# Patient Record
Sex: Female | Born: 1945 | Race: Black or African American | Hispanic: No | Smoking: Former smoker
Health system: Southern US, Community
[De-identification: ages and names within clinical notes are randomized; demographics above are authoritative.]

## PROBLEM LIST (undated history)

## (undated) DIAGNOSIS — I10 Essential (primary) hypertension: Secondary | ICD-10-CM

## (undated) DIAGNOSIS — Z853 Personal history of malignant neoplasm of breast: Secondary | ICD-10-CM

## (undated) HISTORY — PX: MASTECTOMY: SHX3

---

## 2001-09-08 ENCOUNTER — Emergency Department (HOSPITAL_COMMUNITY): Admission: EM | Admit: 2001-09-08 | Discharge: 2001-09-08 | Payer: Self-pay | Admitting: Emergency Medicine

## 2004-12-27 ENCOUNTER — Ambulatory Visit: Payer: Self-pay | Admitting: Physical Medicine & Rehabilitation

## 2004-12-27 ENCOUNTER — Inpatient Hospital Stay (HOSPITAL_COMMUNITY): Admission: EM | Admit: 2004-12-27 | Discharge: 2005-01-15 | Payer: Self-pay | Admitting: Emergency Medicine

## 2005-01-15 ENCOUNTER — Inpatient Hospital Stay (HOSPITAL_COMMUNITY)
Admission: RE | Admit: 2005-01-15 | Discharge: 2005-03-01 | Payer: Self-pay | Admitting: Physical Medicine & Rehabilitation

## 2005-01-15 ENCOUNTER — Ambulatory Visit: Payer: Self-pay | Admitting: Physical Medicine & Rehabilitation

## 2005-03-02 ENCOUNTER — Ambulatory Visit: Payer: Self-pay | Admitting: Family Medicine

## 2005-03-06 ENCOUNTER — Ambulatory Visit: Payer: Self-pay | Admitting: *Deleted

## 2005-04-02 ENCOUNTER — Encounter
Admission: RE | Admit: 2005-04-02 | Discharge: 2005-07-01 | Payer: Self-pay | Admitting: Physical Medicine & Rehabilitation

## 2005-04-02 ENCOUNTER — Ambulatory Visit: Payer: Self-pay | Admitting: Physical Medicine & Rehabilitation

## 2005-07-30 ENCOUNTER — Ambulatory Visit: Payer: Self-pay | Admitting: Physical Medicine & Rehabilitation

## 2005-07-30 ENCOUNTER — Encounter
Admission: RE | Admit: 2005-07-30 | Discharge: 2005-10-28 | Payer: Self-pay | Admitting: Physical Medicine & Rehabilitation

## 2005-09-10 ENCOUNTER — Ambulatory Visit (HOSPITAL_COMMUNITY): Admission: RE | Admit: 2005-09-10 | Discharge: 2005-09-10 | Payer: Self-pay | Admitting: Interventional Radiology

## 2005-11-09 IMAGING — CR DG RIBS 2V*R*
2 series · 2 of 2 positions shown · non-contrast
Comparison: 01/05/2005.

CLINICAL DATA: Right anterior and lateral rib pain following a fall.

CHEST - 2 VIEW

[view not recorded (1 of 2)]
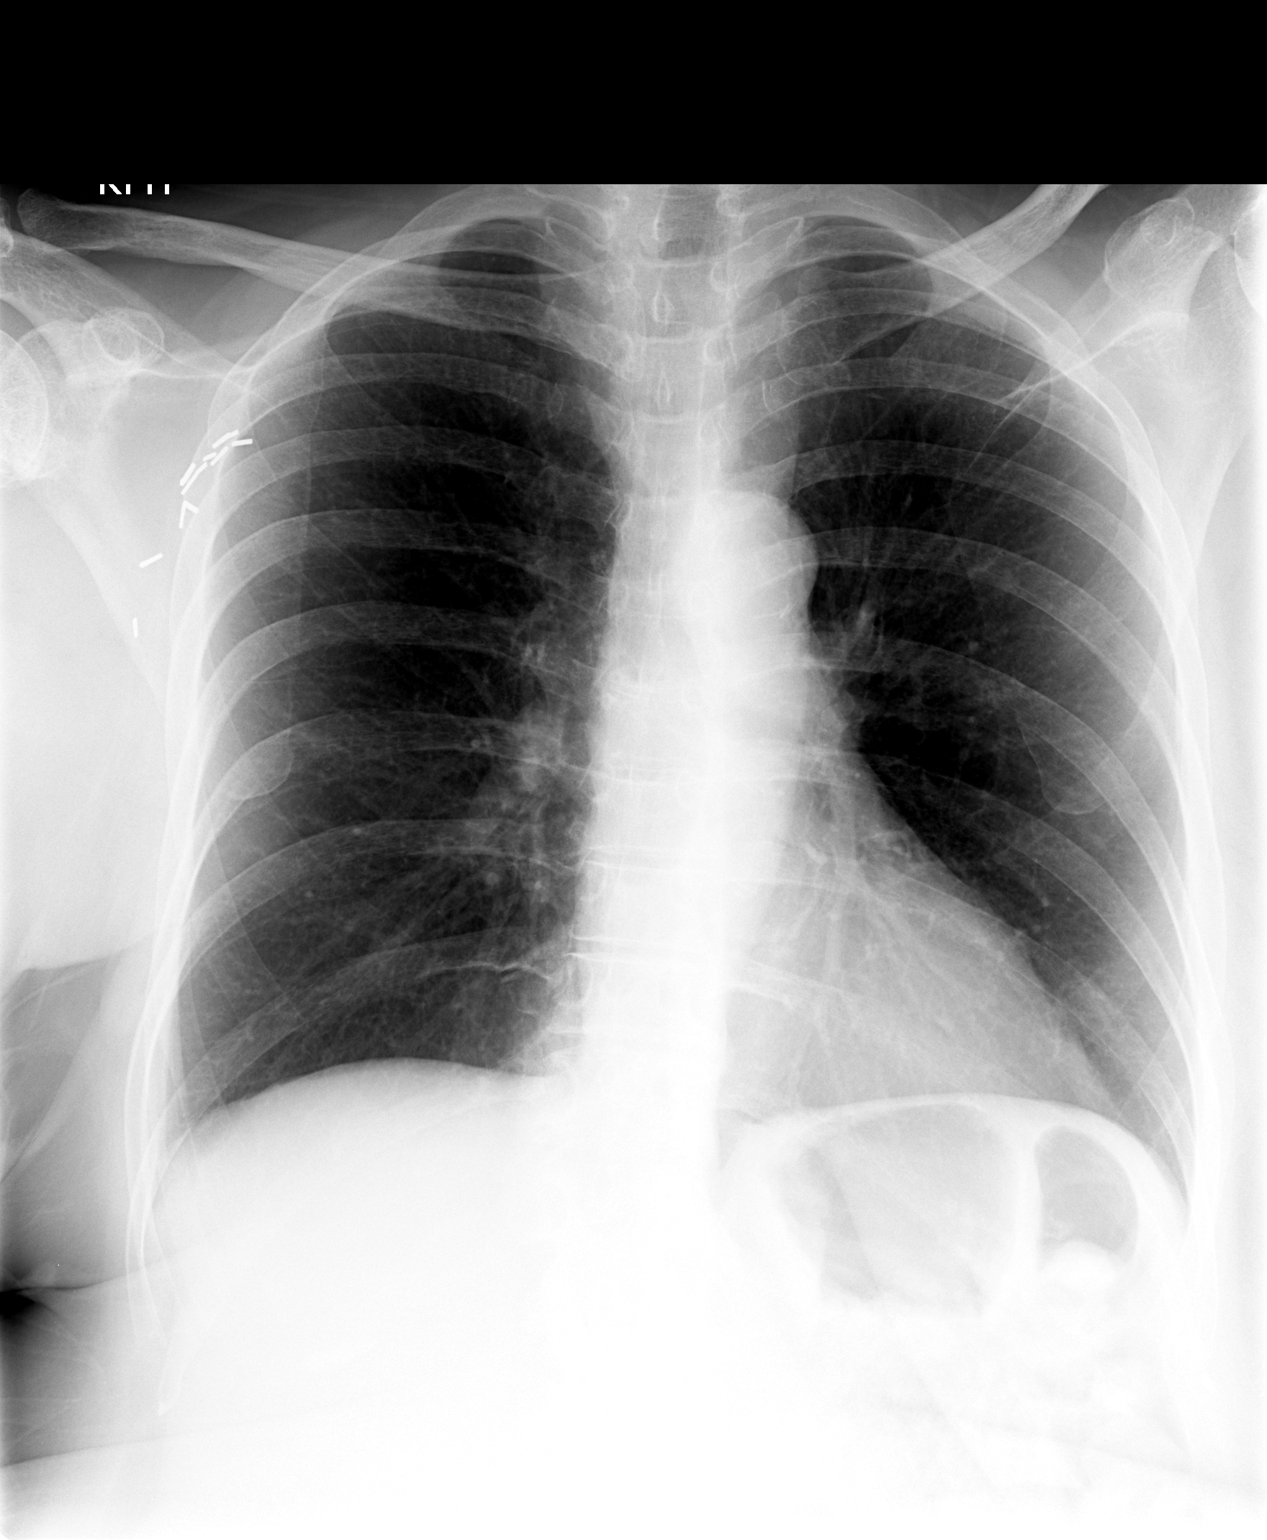

[view not recorded (2 of 2)]
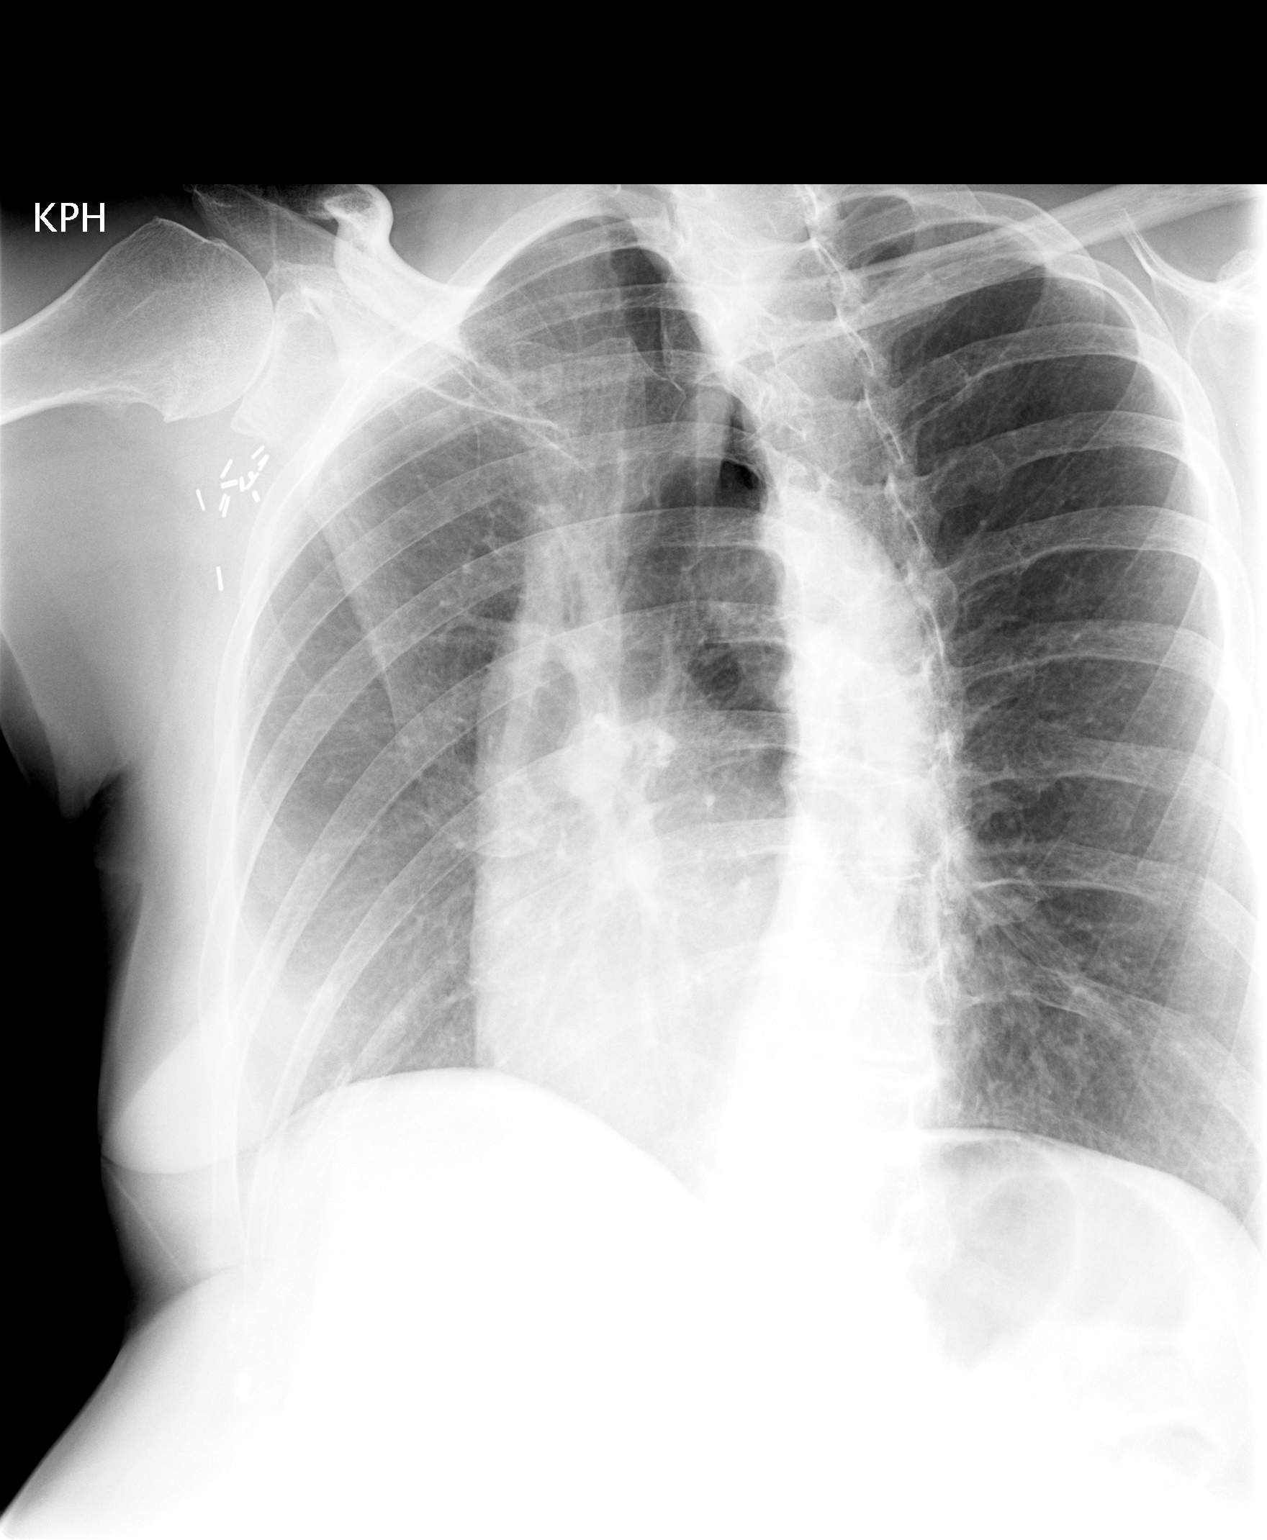

[2 of 2 positions shown; findings below may reference images not displayed]

FINDINGS: Improved inspiration. Normal sized heart. Mild diffuse peribronchial
thickening without significant change. Stable right axillary surgical clips.
Mild thoracic spine degenerative changes. Diffuse osteopenia. No fracture or
pneumothorax seen.

IMPRESSION

Stable mild chronic bronchitic changes. No acute abnormality.

RIGHT RIBS - 2 VIEW
FINDINGS: On the oblique view, a skin fold is crossing the lateral aspect of
the right 9th rib, simulating an oblique fracture line. No true fracture is seen
in the 9th rib or any of the other ribs. No pneumothorax.

IMPRESSION

No fracture.

## 2005-12-25 ENCOUNTER — Encounter
Admission: RE | Admit: 2005-12-25 | Discharge: 2006-03-25 | Payer: Self-pay | Admitting: Physical Medicine & Rehabilitation

## 2005-12-26 ENCOUNTER — Ambulatory Visit: Payer: Self-pay | Admitting: Physical Medicine & Rehabilitation

## 2006-02-04 ENCOUNTER — Ambulatory Visit: Payer: Self-pay | Admitting: Physical Medicine & Rehabilitation

## 2006-04-24 ENCOUNTER — Emergency Department (HOSPITAL_COMMUNITY): Admission: EM | Admit: 2006-04-24 | Discharge: 2006-04-24 | Payer: Self-pay | Admitting: Family Medicine

## 2006-06-03 ENCOUNTER — Ambulatory Visit (HOSPITAL_COMMUNITY): Admission: RE | Admit: 2006-06-03 | Discharge: 2006-06-03 | Payer: Self-pay | Admitting: Interventional Radiology

## 2006-09-11 ENCOUNTER — Emergency Department (HOSPITAL_COMMUNITY): Admission: EM | Admit: 2006-09-11 | Discharge: 2006-09-11 | Payer: Self-pay | Admitting: Emergency Medicine

## 2006-12-23 ENCOUNTER — Ambulatory Visit (HOSPITAL_COMMUNITY): Admission: RE | Admit: 2006-12-23 | Discharge: 2006-12-23 | Payer: Self-pay | Admitting: Interventional Radiology

## 2007-01-02 ENCOUNTER — Emergency Department (HOSPITAL_COMMUNITY): Admission: EM | Admit: 2007-01-02 | Discharge: 2007-01-02 | Payer: Self-pay | Admitting: Family Medicine

## 2007-07-24 ENCOUNTER — Ambulatory Visit (HOSPITAL_COMMUNITY): Admission: RE | Admit: 2007-07-24 | Discharge: 2007-07-24 | Payer: Self-pay | Admitting: Family Medicine

## 2007-07-30 ENCOUNTER — Ambulatory Visit (HOSPITAL_COMMUNITY): Admission: RE | Admit: 2007-07-30 | Discharge: 2007-07-30 | Payer: Self-pay | Admitting: Interventional Radiology

## 2008-11-16 ENCOUNTER — Emergency Department (HOSPITAL_COMMUNITY): Admission: EM | Admit: 2008-11-16 | Discharge: 2008-11-16 | Payer: Self-pay | Admitting: Family Medicine

## 2009-10-27 ENCOUNTER — Encounter: Admission: RE | Admit: 2009-10-27 | Discharge: 2009-10-27 | Payer: Self-pay | Admitting: Internal Medicine

## 2010-12-29 NOTE — Assessment & Plan Note (Signed)
MEDICAL RECORD NUMBER:  [id number]   INTERVAL HISTORY:  [name] is here in followup of her subarachnoid hemorrhage  due to aneurysm status post coiling.  She was on rehab until July 20 and now  has been home with her mother since that time.  Patient has made very nice  gains.  Her pain has been minimal.  Her memory has been improving although  she is still forgetting some day to day things.  She denies any fall.  She  is sleeping fairly well.  In fact, she stopped her trazodone and has still  been doing fairly nicely with this.  She has had full-time supervision  really since being home.  Blood pressure has been under fair control.  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  She denies any blurred vision, double  vision, depression, agitation, anxiety.  She seems to be getting along for  the most well at home.  She is helping with the upkeep of the house in fact.   REVIEW OF SYSTEMS:  Patient denies any neurological, psychiatric,  constitutional, GU, GI, or cardiorespiratory problems today.   SOCIAL HISTORY:  Patient is widowed and lives with her mother as mentioned  above.   PHYSICAL EXAMINATION:  Patient is pleasant in no acute distress.  She is  alert and oriented x3.  Affect is bright and appropriate.  Appearance is  well kept.  Gait is slightly unsteady favoring the left side but there were  no signs of obvious fall risk today.  Coordination was fair.  Reflexes were  2+.  Sensation was normal.  Cognition was excellent.  She was alert to  current events.  She remembered two out of three objects after five minutes.  She was able to do serial 7's five out of five.  She was able to sequence  numbers one out of two times.  She was able to spell the word WORLD  forwards and backwards.  She did abstract thinking without difficulty today.  She recalled current affairs.  She asked appropriate questions about  medications, etc.  Heart was regular rate and rhythm.  Lungs were clear.  Abdomen soft  and nontender.  Motor function was generally 5/5.   ASSESSMENT:  1.  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  2.  Hypertension.  3.  Insomnia.   PLAN:  1.  Patient has made extraordinary gains up to this point.  I think she is      appropriate for being alone for an hour or two during the day and the      family can expand those hours as they see fit.  2.  Continue Catapres for hypertension.  3.  Decrease Zyprexa at 2.5 mg to bedtime.  4.  Decrease Amantidine to off.  5.  Stop trazodone.  6.  Continue Aricept at 10 mg at bedtime.  7.  Consider neuropsychological testing at next visit.  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  8.  I will see her back in about a month's time.     Ranelle Oyster, M.D.  Electronically Signed    ZTS/MedQ  D:  04/03/2005 16:16:48  T:  04/03/2005 22:17:59  Job #:  [id number]

## 2010-12-29 NOTE — Discharge Summary (Signed)
[name], [name]                ACCOUNT NO.:  [id number]   MEDICAL RECORD NO.:  [id number]          PATIENT TYPE:  IPS   LOCATION:  4025                         FACILITY:  [facility]   PHYSICIAN:  Ranelle Oyster, M.D.DATE OF BIRTH:  [birth year]   DATE OF ADMISSION:  01/15/2005  DATE OF DISCHARGE:  03/01/2005                                 DISCHARGE SUMMARY   DISCHARGE DIAGNOSES:  1.  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  2.  Pneumonia, resolved.  3.  Hypertension.  4.  Insomnia, improved.   HISTORY OF PRESENT ILLNESS:  [name] is a 65 year old female originally  admitted to [facility] May 17 with a two to three-day history of  confusion.  A CT of the head done showed diffuse subarachnoid hemorrhage  with clot in ACA in proximal segment, with moderate left frontal edema and a  7 mm left-to-right shift with question of ruptured aneurysm.  Cerebral  angiogram May 17 showed no evidence of aneurysm.  A chest x-ray did show  right lower lobe density with question of pneumonia, and the patient was  started on Avelox for treatment.  A repeat cerebral angiogram on May 23  showed 3.5 mm ACOM aneurysm that was coiled on May 25 without difficulty.  Patient with left-sided weakness post procedure.  CT of head past procedure  showed stable interhemisphere blood.  She was noted to have issues of  vasospasm requiring Nimotop for control.  Follow-up CT scans have shown  decrease in the size of subarachnoid hemorrhage and intraparenchymal  hemorrhage.  The patient continues with left hemiparesis with decrease  __________ decreased balance.  She is noted to be impulsive with poor safety  awareness.  She is currently at minimum assist for transfers, +2 toilet  assist, 70-80% to ambulate.  OT working on sequencing.   PAST MEDICAL HISTORY:  Significant for asthma and right mastectomy for  cancer.   ALLERGIES:  SHELLFISH.   FAMILY HISTORY:  Positive for CVA.   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  She lives in a one-level home with no steps at entry.  Smokes one pack per day.  Does not use any alcohol.   HOSPITAL COURSE:  Ms. [name] was admitted to rehab on January 15, 2005,  for inpatient therapies to consist of PT, OT, speech therapy daily.  Past  admission, Catapres was used initially on 0.1 mg t.i.d. basis.  Nimotop was  tapered off without difficulty.  Labs done for follow-up of acute  posthemorrhagic anemia have shown H&H slowly improving with last check of  CBC revealing hemoglobin 11.1, hematocrit 33.5, white count 5.0, platelets  215.  Check of electrolytes last on June 13 reveals sodium 145, potassium  3.8, chloride 107, CO2 28, BUN 15, creatinine 0.7, glucose 85.  LFTs within  normal limits.  Initially the patient has had problems with attention as  well as follow-through.  She was noted to have difficulty tending to  tasks  with confabulation and question hallucination.  She was also noted to have  problems with insomnia and agitation, especially at night.  She was started  on trazodone initially without much help.  Zyprexa was added to help with  hallucination and agitation at night.  Due to problems with initiation,  bromocriptine was added to help with this.  This showed some minimal  improvement; therefore, Aricept was added to help with memory function.  Notice also neuropsychiatry has been following along.  The patient has shown  a slow but increasing awareness of her deficits.  She is also noted to have  increase in intellectual awareness of some cognitive deficits.  Blood  pressure medications have slowly been adjusted as BP is noted to be  variable.  Catapres was increased to TTS-2 at time of discharge.  Other  medication adjustments include tapering amantadine and Zyprexa over the next  four  weeks.  Aricept was increased to 10 mg q.h.s.  At time of discharge,  the patient is modified independent for bed mobility, modified independent  in supervised setting in her room, requires supervision outside her room.  She is able to ambulate 150-300 feet at supervision to close supervision  basis with use of straight cane.  She is noted to have some decrease in  awareness and has shown increase in safety in compensating for balance  issues.  She is currently at modified independent for ADL needs, including  toileting.  Her basic high-level comprehension is intact.  Basic high-level  expression is intact.  No signs of apraxia or dysarthria are noted.  She is  able to use a memory log to assist with some memory deficits.  Problem  solving skills are improving.  Twenty-four hour supervision is recommended  past discharge, and patient's mother is to provide this.  She will continue  to receive follow-up home health PT/OT by [facility] past discharge.  On March 01, 2005, the patient is discharged to home.   DISCHARGE MEDICATIONS:  1.  Aricept 10 mg q.h.s.  2.  Zyprexa 5 mg q.h.s. x2 weeks, then 2.5 mg q.h.s. x2 weeks until gone.  3.  Trazodone 50 mg q.h.s.  4.  Amantadine 100 mg b.i.d. for two weeks, then one per day x2 weeks until      gone.  5.  Catapres TTS-2 patch, change weekly on Thursdays.  6.  Coated aspirin 325 mg a day.  7.  Senokot S two p.o. q.h.s.  8.  Pepcid 20 mg b.i.d. p.r.n.   ACTIVITY LEVEL:  As tolerated with supervision.  No driving for now.  Use  cane for ambulation.   DIET:  Regular.   SPECIAL INSTRUCTIONS:  [facility] to provide PT/OT.  Patient set up  for follow-up with [facility] July 21.  Patient to follow up with Dr.  Riley Kill August 22 at 3:40.       PP/MEDQ  D:  03/02/2005  T:  03/03/2005  Job:  [id number]   cc:   Sherilyn Cooter A. Pool, M.D.  [address]  [place]  [place] [address] Fax: [phone]   Grandville Silos. Corliss Skains, M.D.   [address]  [place]  [place] [address]  Fax: [phone]

## 2010-12-29 NOTE — Assessment & Plan Note (Signed)
INTERVAL HISTORY:  [name] is back regarding her subarachnoid hemorrhage.  She  has continued to progress from a functional standpoint.  She is actually  home with her mother helping with her care.  Apparently her mother has  dementia.  Patient is having minimal problems with pain.  She describes some  low back pain that usually improves with rest and Tylenol.  She sleeps  fairly well.  Still has a slight limp.  She complains of regular fatigue and  decreased energy.  Her high level balance is still impaired.  Cognitively  she has shown a lot of improvement but does note some mental fatigue and  problems with attention at times.  Her memory is fair for the most part.  She was taken off amantidine and Zyprexa after last visit.  She continues on  Aricept as well as Catapres for hypertension.   REVIEW OF SYSTEMS:  Patient reports trouble walking, spasms occasionally on  the left side as well as loss of taste.  She denies any constitutional, GU,  GI, or cardiorespiratory complaints today.   SOCIAL HISTORY:  Pertinent positives listed above.   PHYSICAL EXAMINATION:  Blood pressure is 174/110, on recheck 45 minutes  later blood pressure is 190/99, pulse is 64, respiratory rate 16, she is  saturating 100% in room air.  Patient is pleasant, no acute distress.  She  is alert and oriented x3.  Affect is bright and appropriate.  Gait is  limping and favoring the left side.  She did not have any buckling.  Otherwise coordination was fair.  Reflexes were 2+.  Sensation was normal.  Heart is regular rate and rhythm.  Lungs are clear.  Abdomen soft,  nontender.  On cognitive examination she was able to organize simple tasks  and numbers with some extra time.  Short-term memory appeared to be  appropriate.  Mood was a bit reserved but within normal limits.  No focal  muscle testing findings were noted.  She did lack some motor coordination of  the left arm and leg to a certain extent.   ASSESSMENT:  1.   Status post subarachnoid hemorrhage and intraparenchymal hemorrhage      after anterior cerebral artery aneurysm with subsequent coiling.  2.  Hypertension.  3.  Insomnia.   PLAN:  1.  Patient continues to progress.  She is safe at the household level to be      on her own.  2.  Continue Catapres for hypertension.  She needs to follow up with her      primary care physician regarding blood pressure medication adjustment.  3.  Stop Aricept today.  4.  I filled out extensive paperwork regarding her long-term disability      policy.  5.  I will see the patient back in about 6 months' time.      Ranelle Oyster, M.D.  Electronically Signed     ZTS/MedQ  D:  07/31/2005 14:06:55  T:  08/01/2005 22:41:48  Job #:  [id number]

## 2010-12-29 NOTE — H&P (Signed)
[name], [name]                ACCOUNT NO.:  [id number]   MEDICAL RECORD NO.:  [id number]          PATIENT TYPE:  IPS   LOCATION:                               FACILITY:  [facility]   PHYSICIAN:  Ranelle Oyster, M.D.DATE OF BIRTH:  04/03/1946   DATE OF ADMISSION:  01/15/2005  DATE OF DISCHARGE:                                HISTORY & PHYSICAL   CHIEF COMPLAINT:  Left-sided weakness.   HISTORY OF PRESENT ILLNESS:  This is a 65 year old black female admitted  with a 2-day history of confusion and weakness.  CT of her head showed  diffuse subarachnoid hemorrhage with clot along the proximal segment of the  ACA.  The patient had moderate left frontal edema with shift to the left and  the right.  The patient underwent emergent cerebral angiogram which was  negative for aneurysm initially.  The patient developed a right lower lobe  density and was started on Avalox for presumptive pneumonia.  Urine drug  screen was positive for amphetamines.  The patient had a repeat cerebral  angiogram done on May 23 and revealed a 3.5 mm Acom aneurysm which was  coiled on May 25.  The patient has had some residual left-sided weakness  since the procedure.  There has been some intermittent hypertension.  She  was placed on Nimotop for vasospasm with the recommendation to keep her  systolic blood pressure greater than 180.  A repeat head scan on May 27  showed improvement in her bleed.  The patient has had continued confusion  and decreased awareness since the admission.  She has a fall on January 11, 2005.  X-rays were negative for fracture.  The patient remains very  impulsive.  She has been up with therapy and has mini-assist for transfers,  has been quad-assist for ambulation.  She has problems with regards her  cognition with sequencing and follow through.   REVIEW OF SYSTEMS:  On review of systems the patient reports weakness.  She  has occasional anxiety.  Sleep is fair.  Full review of systems is  in the  written H&P.   PAST MEDICAL HISTORY:  1.  Positive for asthma.  2.  Right mastectomy for cancer.   FAMILY HISTORY:  Positive for CVA.   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   FUNCTIONAL HISTORY:  The patient is independent prior to arrival.   MEDICATIONS:  Prior to arrival are none.   ALLERGIES:  SHELLFISH.   LABORATORY:  Hemoglobin 11.1, white count 6.2.  Sodium 132, potassium 4.0,  BUN and creatinine 12 and 0.8.   PHYSICAL EXAMINATION:  VITAL SIGNS:  On physical examination pulse is 84,  respiratory rate 12, blood pressure 174/68.  GENERAL:  The patient is pleasant, alert and oriented x3.  HEENT:  Pupils are equal and reactive to light and accommodation.  Extraocular eye movements are intact.  Oral mucosa is pink and moist.  NECK:  Supple without JVD or adenopathy.  CHEST:  Clear to auscultation bilaterally without wheezes, rales or rhonchi.  HEART:  Regular rate and rhythm without murmurs, rubs, or gallops.  EXTREMITIES:  Show no clubbing, cyanosis, or edema.  ABDOMEN:  Soft, nontender.  NEUROLOGIC EXAMINATION:  The patient seemed to have decreased dexterity of  the left side with generalized weakness on the left.  Her strength is in the  3-to-4/5 range throughout the left side in a fluctuating manner.  Right side  strength is 4 plus-to-5/5.  The patient was able to follow 2-step commands  without difficulty. She is alert and oriented to date and place with me.  Sensation appeared to be grossly intact.  There may have been trace  difference in sensorium on the left side.  Reflexes were 1+ to 2+  bilaterally.  The patient was a bit impulsive.  I saw no severe orientation  or behavioral problems today.   ASSESSMENT AND PLAN:  1.  Function deficit secondary to Acom aneurysm with rupture leading to      subarachnoid hemorrhoid and interparenchymal hemorrhage.  The  patient is      status post coiling. She has residual left-sided weakness and cognitive      deficits affecting her awareness and follow through.  The patient will      be involved in PT, OT, speech, and  nursing care along with supervision      of the physician.  Estimated length of stay is 2 weeks.  I am unclear of      disposition planning at this point.  Hopefully there is some family who      can assist her.  Goals will be to reach a supervision-to-modified-      independent level.  2.  Pain management p.r.n. oxycodone.  3.  Deep venous thrombosis prophylaxis with TED hose.  4.  Pneumonia.  Patient has completed antibiotics.  5.  Vasospasm.  Continue Nimotop with goal to taper soon.  6.  Hypertension.  Continue Catapres 0.1 mg t.i.d.       ZTS/MEDQ  D:  01/15/2005  T:  01/15/2005  Job:  [id number]

## 2010-12-29 NOTE — Assessment & Plan Note (Signed)
[name] is back regarding her left subarachnoid hemorrhage.  Neurologically  she is doing fine at this point.  I am seeing her back today regarding her  blood pressure and concerns over the high systolic and diastolic numbers in  the setting of her prior subarachnoid bleed.  Last visit, we increased her  TTS patch to a 0.3 mg.  She states her blood pressure has been better  controlled but still not where she needs it to be.  She has had occasional  low back pain.  She has been busy at home and at work with her son.  She has  a lot of stress on her with her mother as well.   REVIEW OF SYSTEMS:  Negative for any neurological, psychiatric,  constitutional, GU, GI, cardiorespiratory complaints today.   SOCIAL HISTORY:  Pertinent positives listed above.   PHYSICAL EXAMINATION:  VITAL SIGNS:  Blood pressure is 151/77, pulse is 76,  respiratory rate is 16.  She is sating 98% on room air.  NEUROLOGIC:  The patient continues to have some mild gait instability but  regular gait and stride are within normal limits.  She continues to favor  the left side with gait.  Strength is generally 5/5.  Sensation is normal.  Reflexes are 2+.  MENTAL STATUS:  Cognitively she is appropriate.  She has good insight,  awareness, and memory.  HEART:  Regular rate and rhythm.  LUNGS:  Clear.  ABDOMEN:  Soft nontender.  She remains overweight.   ASSESSMENT:  1.  Status post subarachnoid hemorrhage and intraparenchymal hemorrhage      after ACA ruptured aneurysm.  2.  Hypertension.  3.  Insomnia.   PLAN:  1.  Add hydrochlorothiazide 12.5 mg every day. Advised appropriate potassium      intake along with this.  2.  Continue TTS patch #3.  3.  We will make a referral to [facility] physicians in [place] to assume      primary care coverage.  4.  I will see her back as needed in the future.      Ranelle Oyster, M.D.  Electronically Signed     ZTS/MedQ  D:  02/06/2006 12:07:07  T:  02/06/2006 12:52:44   Job #:  [id number]

## 2010-12-29 NOTE — Assessment & Plan Note (Signed)
MEDICAL RECORD NUMBER:  [id number]   [name] is back regarding her subarachnoid hemorrhage.  She has been doing  very well from a neurological standpoint.  She denies any problems with  weakness or balance except for higher-level balance.  She has had no  significant headaches.  Vision has been good.  She denies any dizziness,  weakness, or depression.  She has been a bit stressed by family situations.  She is having to help care for her 63 year old mother who is declining  cognitively and from a safety standpoint.  She is sleeping well currently.  Her one concern is her blood pressure.  It was high again on recheck here  today at the office.  She does not have a family practice physician.   REVIEW OF SYSTEMS:  The patient denies any new neurological, psychiatric,  constitutional, GU, GI, or cardiorespiratory complaints other than those  mentioned above.  Full review is in the health and history section.   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   PHYSICAL EXAMINATION:  Blood pressure is 159/93.  Last blood pressure check  at our office was 174/110.  The patient's pulse is 64, respiratory rate 17,  she is saturating 98% in room air.  The patient is pleasant, in no acute  distress.  She is alert and oriented x3.  Affect is bright and appropriate.  Gait is generally stable except when we walk heel-to-toe where she lost some  balance.  She was wearing some narrow shoes which did not help.  Reflexes  are 2+.  Sensation was normal.  Cranial nerve exam was intact.  She had 5/5  strength in all four extremities.  Romberg's test was negative.  Pronator  drift was not seen.  She had good insight, awareness, and memory today.  Heart was regular rate and rhythm, lungs were clear, abdomen soft and  nontender.  She remains overweight.   ASSESSMENT:  1.  Status post subarachnoid hemorrhage and intraparenchymal hemorrhage      after ACA ruptured aneurysm.  2.  Hypertension.  3.  Insomnia.   PLAN:  1.  The patient is doing well from a neurological standpoint.  She is quite      stable, in fact.  2.  Need to aggressively address her blood pressure.  We will increase her      TTS patch to a #3 today.  I am going to recheck her blood pressure again      before she leaves the office.  She may need another agent.  3.  I will see her back in about 6 weeks to follow up her blood pressure.  I      would like for her to check it on her own at home if possible.  She also      needs to find a family physician.      Ranelle Oyster, M.D.  Electronically Signed     ZTS/MedQ  D:  12/26/2005 09:44:21  T:  12/26/2005 15:13:29  Job #:  [id number]

## 2010-12-29 NOTE — Discharge Summary (Signed)
NAMEROSY, ESTABROOK                ACCOUNT NO.:  [id number]   MEDICAL RECORD NO.:  [id number]          PATIENT TYPE:  INP   LOCATION:  3011                         FACILITY:  [facility]   PHYSICIAN:  Kathaleen Maser. Pool, M.D.    DATE OF BIRTH:  1946-03-10   DATE OF ADMISSION:  12/27/2004  DATE OF DISCHARGE:  01/15/2005                                 DISCHARGE SUMMARY   FINAL DIAGNOSIS:  Ruptured anterior communicating artery aneurysm.   HISTORY OF PRESENT ILLNESS:  Ms. Coventry is a 65 year old female who was  admitted for treatment of a subarachnoid hemorrhage. Workup is consistent  with anterior communicating artery aneurysm rupture. Patient is taken to  arteriography for further evaluation.   HOSPITAL COURSE:  The patient was taken to angiography where a negative  cerebral arteriogram was performed. The patient was admitted to the ICU,  placed on subarachnoid hemorrhage precautions and kept well hydrated. Follow-  up arteriogram did demonstrate a small anterior communicating artery  aneurysm. She subsequently underwent interatrial coiling of the aneurysm.  Postoperatively, she did reasonably well. She still had some left lower  extremity weakness which gradually improved with time. The patient's mental  status also gradually improved. She was immobilized with the help of  physical and occupational therapy. She was eventually deemed suitable for  further treatment through the rehab service. At time of discharge, the  patient is awake, aware, following commands and conversant. She has some  mild lower extremity weakness. She has had no further episodes worrisome for  subarachnoid hemorrhage. She has no evidence of significant hydrocephalus.   CONDITION ON DISCHARGE:  Improved.           ______________________________  Kathaleen Maser Pool, M.D.     HAP/MEDQ  D:  04/03/2005  T:  04/03/2005  Job:  [id number]

## 2011-05-18 LAB — CREATININE, SERUM: GFR calc non Af Amer: >60

## 2012-07-02 DIAGNOSIS — Z1231 Encounter for screening mammogram for malignant neoplasm of breast: Secondary | ICD-10-CM | POA: Diagnosis not present

## 2013-04-28 DIAGNOSIS — E785 Hyperlipidemia, unspecified: Secondary | ICD-10-CM | POA: Diagnosis not present

## 2013-04-28 DIAGNOSIS — I10 Essential (primary) hypertension: Secondary | ICD-10-CM | POA: Diagnosis not present

## 2013-04-28 DIAGNOSIS — M249 Joint derangement, unspecified: Secondary | ICD-10-CM | POA: Diagnosis not present

## 2013-05-18 DIAGNOSIS — Z01419 Encounter for gynecological examination (general) (routine) without abnormal findings: Secondary | ICD-10-CM | POA: Diagnosis not present

## 2013-05-18 DIAGNOSIS — Z124 Encounter for screening for malignant neoplasm of cervix: Secondary | ICD-10-CM | POA: Diagnosis not present

## 2013-05-19 ENCOUNTER — Other Ambulatory Visit: Payer: Self-pay | Admitting: Obstetrics & Gynecology

## 2013-05-19 DIAGNOSIS — E2839 Other primary ovarian failure: Secondary | ICD-10-CM

## 2014-07-16 ENCOUNTER — Ambulatory Visit (INDEPENDENT_AMBULATORY_CARE_PROVIDER_SITE_OTHER): Payer: Medicare Other | Admitting: Emergency Medicine

## 2014-07-16 VITALS — BP 126/80 | HR 73 | Temp 98.0°F | Resp 17 | Ht 63.0 in | Wt 226.0 lb

## 2014-07-16 DIAGNOSIS — Z8679 Personal history of other diseases of the circulatory system: Secondary | ICD-10-CM

## 2014-07-16 NOTE — Patient Instructions (Signed)

## 2014-07-16 NOTE — Progress Notes (Signed)
[facility] [address] [place] [address] [phone]  Date:  07/16/2014   Name:  [name]   DOB:  31-Oct-1945   MRN:  [id number]  PCP:  No PCP Per Patient    Chief Complaint: Establish Care and Medication Refill   History of Present Illness:  [name] is a 68 y.o. very pleasant female patient who presents with the following:  Patient has been on losartan 160 and amlodipine 10 for control of hypertension for 9 years.  Ran out 3 weeks ago and is feeling well Not symptomatic.  Is concerned her pressure is too high at 126/80 Denies other complaint or health concern today.   There are no active problems to display for this patient.   No past medical history on file.  No past surgical history on file.  History  Substance Use Topics  . Smoking status: Current Every Day Smoker -- 0.40 packs/day for 20 years    Types: Cigarettes  . Smokeless tobacco: Not on file  . Alcohol Use: Not on file    No family history on file.  Not on File  Medication list has been reviewed and updated.  No current outpatient prescriptions on file prior to visit.   No current facility-administered medications on file prior to visit.    Review of Systems:  As per HPI, otherwise negative.    Physical Examination: Filed Vitals:   07/16/14 1552  BP: 126/80  Pulse: 73  Temp: 98 F (36.7 C)  Resp: 17   Filed Vitals:   07/16/14 1552  Height: 5\' 3"  (1.6 m)  Weight: 226 lb (102.513 kg)   Body mass index is 40.04 kg/(m^2). Ideal Body Weight: Weight in (lb) to have BMI = 25: 140.8  GEN: WDWN, NAD, Non-toxic, A & O x 3 HEENT: Atraumatic, Normocephalic. Neck supple. No masses, No LAD. Ears and Nose: No external deformity. CV: RRR, No M/G/R. No JVD. No thrill. No extra heart sounds. PULM: CTA B, no wheezes, crackles, rhonchi. No retractions. No resp. distress. No accessory muscle use. ABD: S, NT, ND, +BS. No rebound. No HSM. EXTR: No c/c/e NEURO  Normal gait.  PSYCH: Normally interactive. Conversant. Not depressed or anxious appearing.  Calm demeanor.    Assessment and Plan: Obesity  History of hypertension  Signed,  Ellison Carwin, MD

## 2014-07-19 ENCOUNTER — Ambulatory Visit: Payer: Medicare Other

## 2014-08-09 ENCOUNTER — Emergency Department (INDEPENDENT_AMBULATORY_CARE_PROVIDER_SITE_OTHER)
Admission: EM | Admit: 2014-08-09 | Discharge: 2014-08-09 | Disposition: A | Discharge status: Home / Self Care | Payer: Medicare Other | Source: Home / Self Care | Attending: Emergency Medicine | Admitting: Emergency Medicine

## 2014-08-09 ENCOUNTER — Encounter (HOSPITAL_COMMUNITY): Payer: Self-pay

## 2014-08-09 DIAGNOSIS — R030 Elevated blood-pressure reading, without diagnosis of hypertension: Secondary | ICD-10-CM | POA: Diagnosis not present

## 2014-08-09 DIAGNOSIS — I10 Essential (primary) hypertension: Secondary | ICD-10-CM

## 2014-08-09 DIAGNOSIS — IMO0001 Reserved for inherently not codable concepts without codable children: Secondary | ICD-10-CM

## 2014-08-09 HISTORY — DX: Essential (primary) hypertension: I10

## 2014-08-09 MED ORDER — AMLODIPINE BESYLATE-VALSARTAN 10-160 MG PO TABS: 1.0000 | ORAL_TABLET | Freq: Every day | ORAL | Status: DC

## 2014-08-09 NOTE — ED Notes (Signed)
Needs amlodipine/valsartin refill

## 2014-08-09 NOTE — ED Provider Notes (Signed)
CSN: [id number]     Arrival date & time 08/09/14  1006 History   First MD Initiated Contact with Patient 08/09/14 1038     Chief Complaint  Patient presents with  . Medication Refill   (Consider location/radiation/quality/duration/timing/severity/associated sxs/prior Treatment) HPI Comments: 68 year old female presents for a refill of her antihypertensives. She states she has had hypertension diagnosis for several years and has been taking Exforge. She was seen at [facility] urgent care on December 4 where her blood pressure was 120/86. Because she had a normal blood pressure without having been taking her medicines for 3 weeks the provider did not refill her medication. She states that she lost her primary care provider approximately 1 year ago and has not had time to find another one to take her place. Although, she has an appointment with a [facility] towards the end of January. She woke this morning and took her blood pressure and it was elevated with systolic in the 010U. She states that she was just not feeling well in general but had no specific complaint.   Past Medical History  Diagnosis Date  . Hypertension    Past Surgical History  Procedure Laterality Date  . Mastectomy     History reviewed. No pertinent family history. History  Substance Use Topics  . Smoking status: Current Every Day Smoker -- 0.40 packs/day for 20 years    Types: Cigarettes  . Smokeless tobacco: Not on file  . Alcohol Use: Not on file   OB History    No data available     Review of Systems  Constitutional: Positive for activity change. Negative for fever.  HENT: Negative.   Respiratory: Negative for shortness of breath.   Cardiovascular: Negative for chest pain and leg swelling.  Gastrointestinal: Negative.   Neurological: Negative.     Allergies  Shellfish allergy  Home Medications   Prior to Admission medications   Medication Sig Start Date End Date Taking? Authorizing Provider   amLODipine-valsartan (EXFORGE) 10-160 MG per tablet Take 1 tablet by mouth daily. 08/09/14   Janne Napoleon, NP   BP 165/98 mmHg  Pulse 69  Temp(Src) 97.9 F (36.6 C) (Oral)  Resp 14  SpO2 97% Physical Exam  Constitutional: She is oriented to person, place, and time. She appears well-developed and well-nourished. No distress.  Neck: Normal range of motion. Neck supple.  Cardiovascular: Normal rate, regular rhythm, normal heart sounds and intact distal pulses.   Pulmonary/Chest: Effort normal and breath sounds normal. No respiratory distress. She has no wheezes. She has no rales.  Musculoskeletal: She exhibits no edema.  Neurological: She is alert and oriented to person, place, and time. She exhibits normal muscle tone.  Skin: Skin is warm and dry.  Psychiatric: She has a normal mood and affect.  Nursing note and vitals reviewed.   ED Course  Procedures (including critical care time) Labs Review Labs Reviewed - No data to display  Imaging Review No results found.   MDM   1. Elevated blood pressure   2. Essential hypertension    Patient is made aware of the risk of prescribing medicine such as an antihypertensives when adequate information has not been obtained such as lab work and serial blood pressures. She states she understands that there is a risk of possible organ damage such as the kidney or drops in blood pressure that are too low or insufficient control of blood pressure. In addition there side effects to the medications in which a  primary care or internal medicine provider should be monitoring. Refill of amlodipine 10 mg and losartan 160 mg #30 prescribed. No refill.      Janne Napoleon, NP 08/09/14 [phone]

## 2014-08-09 NOTE — Discharge Instructions (Signed)
Hypertension Hypertension is another name for high blood pressure. High blood pressure forces your heart to work harder to pump blood. A blood pressure reading has two numbers, which includes a higher number over a lower number (example: 110/72). HOME CARE   Have your blood pressure rechecked by your doctor.  Only take medicine as told by your doctor. Follow the directions carefully. The medicine does not work as well if you skip doses. Skipping doses also puts you at risk for problems.  Do not smoke.  Monitor your blood pressure at home as told by your doctor. GET HELP IF:  You think you are having a reaction to the medicine you are taking.  You have repeat headaches or feel dizzy.  You have puffiness (swelling) in your ankles.  You have trouble with your vision. GET HELP RIGHT AWAY IF:   You get a very bad headache and are confused.  You feel weak, numb, or faint.  You get chest or belly (abdominal) pain.  You throw up (vomit).  You cannot breathe very well. MAKE SURE YOU:   Understand these instructions.  Will watch your condition.  Will get help right away if you are not doing well or get worse. Document Released: 01/16/2008 Document Revised: 08/04/2013 Document Reviewed: 05/22/2013 [facility] Patient Information 2015 [place], [place]. This information is not intended to replace advice given to you by your health care provider. Make sure you discuss any questions you have with your health care provider.  Managing Your High Blood Pressure Blood pressure is a measurement of how forceful your blood is pressing against the walls of the arteries. Arteries are muscular tubes within the circulatory system. Blood pressure does not stay the same. Blood pressure rises when you are active, excited, or nervous; and it lowers during sleep and relaxation. If the numbers measuring your blood pressure stay above normal most of the time, you are at risk for health problems. High blood  pressure (hypertension) is a long-term (chronic) condition in which blood pressure is elevated. A blood pressure reading is recorded as two numbers, such as 120 over 80 (or 120/80). The first, higher number is called the systolic pressure. It is a measure of the pressure in your arteries as the heart beats. The second, lower number is called the diastolic pressure. It is a measure of the pressure in your arteries as the heart relaxes between beats.  Keeping your blood pressure in a normal range is important to your overall health and prevention of health problems, such as heart disease and stroke. When your blood pressure is uncontrolled, your heart has to work harder than normal. High blood pressure is a very common condition in adults because blood pressure tends to rise with age. Men and women are equally likely to have hypertension but at different times in life. Before age 35, men are more likely to have hypertension. After 66 years of age, women are more likely to have it. Hypertension is especially common in African Americans. This condition often has no signs or symptoms. The cause of the condition is usually not known. Your caregiver can help you come up with a plan to keep your blood pressure in a normal, healthy range. BLOOD PRESSURE STAGES Blood pressure is classified into four stages: normal, prehypertension, stage 1, and stage 2. Your blood pressure reading will be used to determine what type of treatment, if any, is necessary. Appropriate treatment options are tied to these four stages:  Normal  Systolic pressure (mm Hg):  below 120.  Diastolic pressure (mm Hg): below 80. Prehypertension  Systolic pressure (mm Hg): 120 to 139.  Diastolic pressure (mm Hg): 80 to 89. Stage1  Systolic pressure (mm Hg): 140 to 159.  Diastolic pressure (mm Hg): 90 to 99. Stage2  Systolic pressure (mm Hg): 160 or above.  Diastolic pressure (mm Hg): 100 or above. RISKS RELATED TO HIGH BLOOD  PRESSURE Managing your blood pressure is an important responsibility. Uncontrolled high blood pressure can lead to:  A heart attack.  A stroke.  A weakened blood vessel (aneurysm).  Heart failure.  Kidney damage.  Eye damage.  Metabolic syndrome.  Memory and concentration problems. HOW TO MANAGE YOUR BLOOD PRESSURE Blood pressure can be managed effectively with lifestyle changes and medicines (if needed). Your caregiver will help you come up with a plan to bring your blood pressure within a normal range. Your plan should include the following: Education  Read all information provided by your caregivers about how to control blood pressure.  Educate yourself on the latest guidelines and treatment recommendations. New research is always being done to further define the risks and treatments for high blood pressure. Lifestylechanges  Control your weight.  Avoid smoking.  Stay physically active.  Reduce the amount of salt in your diet.  Reduce stress.  Control any chronic conditions, such as high cholesterol or diabetes.  Reduce your alcohol intake. Medicines  Several medicines (antihypertensive medicines) are available, if needed, to bring blood pressure within a normal range. Communication  Review all the medicines you take with your caregiver because there may be side effects or interactions.  Talk with your caregiver about your diet, exercise habits, and other lifestyle factors that may be contributing to high blood pressure.  See your caregiver regularly. Your caregiver can help you create and adjust your plan for managing high blood pressure. RECOMMENDATIONS FOR TREATMENT AND FOLLOW-UP  The following recommendations are based on current guidelines for managing high blood pressure in nonpregnant adults. Use these recommendations to identify the proper follow-up period or treatment option based on your blood pressure reading. You can discuss these options with your  caregiver.  Systolic pressure of 938 to 182 or diastolic pressure of 80 to 89: Follow up with your caregiver as directed.  Systolic pressure of 993 to 716 or diastolic pressure of 90 to 100: Follow up with your caregiver within 2 months.  Systolic pressure above 967 or diastolic pressure above 893: Follow up with your caregiver within 1 month.  Systolic pressure above 810 or diastolic pressure above 175: Consider antihypertensive therapy; follow up with your caregiver within 1 week.  Systolic pressure above 102 or diastolic pressure above 585: Begin antihypertensive therapy; follow up with your caregiver within 1 week. Document Released: 04/23/2012 Document Reviewed: 04/23/2012 [facility] Patient Information 2015 [place]. This information is not intended to replace advice given to you by your health care provider. Make sure you discuss any questions you have with your health care provider.

## 2020-09-13 ENCOUNTER — Emergency Department (HOSPITAL_COMMUNITY): Payer: Medicare Other

## 2020-09-13 ENCOUNTER — Encounter (HOSPITAL_COMMUNITY): Payer: Self-pay | Admitting: Emergency Medicine

## 2020-09-13 ENCOUNTER — Other Ambulatory Visit: Payer: Self-pay

## 2020-09-13 ENCOUNTER — Inpatient Hospital Stay (HOSPITAL_COMMUNITY)
Admission: EM | Admit: 2020-09-13 | Discharge: 2020-09-19 | DRG: 923 | Disposition: A | Discharge status: Skilled Nursing Facility | Payer: Medicare Other | Attending: Family Medicine | Admitting: Family Medicine

## 2020-09-13 DIAGNOSIS — I959 Hypotension, unspecified: Secondary | ICD-10-CM | POA: Diagnosis present

## 2020-09-13 DIAGNOSIS — R0789 Other chest pain: Secondary | ICD-10-CM | POA: Diagnosis not present

## 2020-09-13 DIAGNOSIS — Z20822 Contact with and (suspected) exposure to covid-19: Secondary | ICD-10-CM | POA: Diagnosis present

## 2020-09-13 DIAGNOSIS — Z9010 Acquired absence of unspecified breast and nipple: Secondary | ICD-10-CM

## 2020-09-13 DIAGNOSIS — R001 Bradycardia, unspecified: Secondary | ICD-10-CM | POA: Diagnosis not present

## 2020-09-13 DIAGNOSIS — Z8249 Family history of ischemic heart disease and other diseases of the circulatory system: Secondary | ICD-10-CM

## 2020-09-13 DIAGNOSIS — I10 Essential (primary) hypertension: Secondary | ICD-10-CM | POA: Diagnosis not present

## 2020-09-13 DIAGNOSIS — E876 Hypokalemia: Secondary | ICD-10-CM | POA: Diagnosis present

## 2020-09-13 DIAGNOSIS — R079 Chest pain, unspecified: Secondary | ICD-10-CM | POA: Diagnosis not present

## 2020-09-13 DIAGNOSIS — T68XXXA Hypothermia, initial encounter: Secondary | ICD-10-CM | POA: Diagnosis not present

## 2020-09-13 DIAGNOSIS — X31XXXA Exposure to excessive natural cold, initial encounter: Secondary | ICD-10-CM

## 2020-09-13 DIAGNOSIS — F17210 Nicotine dependence, cigarettes, uncomplicated: Secondary | ICD-10-CM | POA: Diagnosis present

## 2020-09-13 DIAGNOSIS — Z91013 Allergy to seafood: Secondary | ICD-10-CM

## 2020-09-13 DIAGNOSIS — Z79899 Other long term (current) drug therapy: Secondary | ICD-10-CM

## 2020-09-13 HISTORY — DX: Personal history of malignant neoplasm of breast: Z85.3

## 2020-09-13 LAB — LACTIC ACID, PLASMA: Lactic Acid, Venous: 1.9 mmol/L (ref 0.5–1.9)

## 2020-09-13 LAB — BASIC METABOLIC PANEL
Anion gap: 16 — ABNORMAL HIGH (ref 5–15)
BUN: 25 mg/dL — ABNORMAL HIGH (ref 8–23)
CO2: 21 mmol/L — ABNORMAL LOW (ref 22–32)
Calcium: 9.7 mg/dL (ref 8.9–10.3)
Chloride: 101 mmol/L (ref 98–111)
Creatinine, Ser: 0.87 mg/dL (ref 0.44–1.00)
GFR, Estimated: >60 mL/min (ref >60)
Glucose, Bld: 139 mg/dL — ABNORMAL HIGH (ref 70–99)
Potassium: 3.6 mmol/L (ref 3.5–5.1)
Sodium: 138 mmol/L (ref 135–145)

## 2020-09-13 LAB — CBC
HCT: 44.4 % (ref 36.0–46.0)
HCT: 37.5 % (ref 36.0–46.0)
Hemoglobin: 14 g/dL (ref 12.0–15.0)
Hemoglobin: 12.3 g/dL (ref 12.0–15.0)
MCH: 26.6 pg (ref 26.0–34.0)
MCH: 26.6 pg (ref 26.0–34.0)
MCHC: 31.5 g/dL (ref 30.0–36.0)
MCHC: 32.8 g/dL (ref 30.0–36.0)
MCV: 84.4 fL (ref 80.0–100.0)
MCV: 81.2 fL (ref 80.0–100.0)
Platelets: 231 10*3/uL (ref 150–400)
Platelets: 224 10*3/uL (ref 150–400)
RBC: 5.26 MIL/uL — ABNORMAL HIGH (ref 3.87–5.11)
RBC: 4.62 MIL/uL (ref 3.87–5.11)
RDW: 14.7 % (ref 11.5–15.5)
RDW: 14.4 % (ref 11.5–15.5)
WBC: 4.2 10*3/uL (ref 4.0–10.5)
WBC: 4.8 10*3/uL (ref 4.0–10.5)
nRBC: 0 % (ref 0.0–0.2)
nRBC: 0 % (ref 0.0–0.2)

## 2020-09-13 LAB — LIPASE, BLOOD: Lipase: 19 U/L (ref 11–51)

## 2020-09-13 LAB — SARS CORONAVIRUS 2 BY RT PCR (HOSPITAL ORDER, PERFORMED IN ~~LOC~~ HOSPITAL LAB): SARS Coronavirus 2: NEGATIVE

## 2020-09-13 LAB — CREATININE, SERUM
Creatinine, Ser: 0.9 mg/dL (ref 0.44–1.00)
GFR, Estimated: >60 mL/min (ref >60)

## 2020-09-13 LAB — TROPONIN I (HIGH SENSITIVITY)
Troponin I (High Sensitivity): 10 ng/L (ref <18)
Troponin I (High Sensitivity): 9 ng/L (ref <18)

## 2020-09-13 LAB — T4, FREE: Free T4: 1.2 ng/dL — ABNORMAL HIGH (ref 0.61–1.12)

## 2020-09-13 LAB — TSH: TSH: 4.561 u[IU]/mL — ABNORMAL HIGH (ref 0.350–4.500)

## 2020-09-13 LAB — CK: Total CK: 102 U/L (ref 38–234)

## 2020-09-13 MED ORDER — ACETAMINOPHEN 325 MG PO TABS: 650.0000 mg | ORAL_TABLET | Freq: Four times a day (QID) | ORAL | Status: DC | PRN

## 2020-09-13 MED ORDER — ACETAMINOPHEN 650 MG RE SUPP: 650.0000 mg | Freq: Four times a day (QID) | RECTAL | Status: DC | PRN

## 2020-09-13 MED ORDER — ONDANSETRON HCL 4 MG/2ML IJ SOLN: 4.0000 mg | Freq: Four times a day (QID) | INTRAMUSCULAR | Status: DC | PRN

## 2020-09-13 MED ORDER — SODIUM CHLORIDE 0.9 % IV SOLN: INTRAVENOUS | Status: DC

## 2020-09-13 MED ORDER — HEPARIN SODIUM (PORCINE) 5000 UNIT/ML IJ SOLN
5000.0000 [IU] | Freq: Three times a day (TID) | INTRAMUSCULAR | Status: DC
  Administered 2020-09-13 – 2020-09-18 (×14): 5000 [IU] via SUBCUTANEOUS
  Filled 2020-09-13 (×13): qty 1

## 2020-09-13 MED ORDER — SODIUM CHLORIDE 0.9 % IV BOLUS
1000.0000 mL | Freq: Once | INTRAVENOUS | Status: AC
  Administered 2020-09-13: 1000 mL via INTRAVENOUS

## 2020-09-13 MED ORDER — POLYETHYLENE GLYCOL 3350 17 G PO PACK: 17.0000 g | PACK | Freq: Every day | ORAL | Status: DC | PRN

## 2020-09-13 MED ORDER — ONDANSETRON HCL 4 MG PO TABS: 4.0000 mg | ORAL_TABLET | Freq: Four times a day (QID) | ORAL | Status: DC | PRN

## 2020-09-13 MED ORDER — SODIUM CHLORIDE 0.9 % IV SOLN: 1.0000 g | Freq: Once | INTRAVENOUS | Status: DC

## 2020-09-13 NOTE — ED Triage Notes (Signed)
Pt arrives via [facility] from home with c/o chest pain since yesterday, states pain worse with movement. Endorses some nausea en route. Took one 81 mg asa at home, ems 12 lead unremarkable. EMS VSS 138 palp, rr 16, hr 64, 99% on room air.

## 2020-09-13 NOTE — H&P (Signed)
History and Physical  [name] QJJ:[id number] DOB: [birth year] DOA: 09/13/2020  PCP: Patient, No Pcp Per Patient coming from: Home  I have personally briefly reviewed patient's old medical records in [place]   Chief Complaint: Chest pain  HPI: [name] is a 75 y.o. female past medical history of essential hypertension the hospital for atypical chest pain she really cannot describe, she relates she was in bed when she had it nothing makes it better or worse she denies any associated symptoms.  She relates no new medications or over-the-counter medications.  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  She has not been started on any oral hypoglycemic agents no recent trauma. In the ED: She was moderately hypothermic degrees Celsius mildly bradycardic 40s to 50s and mildly hypotensive saturations  than 100% on room air, her sodium was 138 he ate glucose was 139, she relates no sores or cuts her white count is 4.2, TSH is 4.5 and chest x-ray shows no acute abnormalities   Review of Systems: All systems reviewed and apart from history of presenting illness, are negative.  Past Medical History:  Diagnosis Date  . Hypertension    Past Surgical History:  Procedure Laterality Date  . MASTECTOMY     Social History:  reports that she has been smoking cigarettes. She has a 8.00 pack-year smoking history. She does not have any smokeless tobacco history on file. She reports previous drug use. No history on file for alcohol use.   Allergies  Allergen Reactions  . Shellfish Allergy     Family History  Problem Relation Age of Onset  . Heart attack Mother   . Heart failure Father      Prior to Admission medications   Medication Sig Start Date End Date Taking? Authorizing Provider  amLODipine-valsartan (EXFORGE) 10-160 MG per tablet Take 1 tablet by mouth daily. 08/09/14   Janne Napoleon, NP   Physical  Exam: Vitals:   09/13/20 1210 09/13/20 1215 09/13/20 1220 09/13/20 1223  BP:    (!) 90/57  Pulse: 60 (!) 59 (!) 58 (!) 58  Resp: 11 11 10 11   Temp:      TempSrc:      SpO2: 99% 98% 98% 97%  Weight:      Height:         General exam: Moderately built and nourished patient, lying comfortably supine on the gurney in no obvious distress.  Head, eyes and ENT: Nontraumatic and normocephalic.  Mucous members are moist  Neck: Supple. No JVD, carotid bruit or thyromegaly.  Lymphatics: No lymphadenopathy.  Respiratory system: Clear to auscultation. No increased work of breathing.  Cardiovascular system: S1 and S2 heard, RRR. No JVD, murmurs, gallops, clicks or pedal edema.  Gastrointestinal system: Abdomen is nondistended, soft and nontender. Normal bowel sounds heard. No organomegaly or masses appreciated.  Central nervous system: Alert and oriented. No focal neurological deficits.  Extremities: Symmetric 5 x 5 power. Peripheral pulses symmetrically felt.   Skin: No rashes or acute findings.  Musculoskeletal system: Negative exam.  Psychiatry: Pleasant and cooperative.   Labs on Admission:  Basic Metabolic Panel: Recent Labs  Lab 09/13/20 0730  NA 138  K 3.6  CL 101  CO2 21*  GLUCOSE 139*  BUN 25*  CREATININE 0.87  CALCIUM 9.7   Liver Function Tests: No results for input(s): AST, ALT, ALKPHOS, BILITOT, PROT, ALBUMIN in the  last 168 hours. No results for input(s): LIPASE, AMYLASE in the last 168 hours. No results for input(s): AMMONIA in the last 168 hours. CBC: Recent Labs  Lab 09/13/20 0730  WBC 4.2  HGB 14.0  HCT 44.4  MCV 84.4  PLT 231   Cardiac Enzymes: No results for input(s): CKTOTAL, CKMB, CKMBINDEX, TROPONINI in the last 168 hours.  BNP (last 3 results) No results for input(s): PROBNP in the last 8760 hours. CBG: No results for input(s): GLUCAP in the last 168 hours.  Radiological Exams on Admission: DG Chest 2 View  Result Date:  09/13/2020 CLINICAL DATA:  Chest pain EXAM: CHEST - 2 VIEW COMPARISON:  01/17/2005 FINDINGS: Postoperative right axilla.  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 Normal heart size and stable aortic tortuosity. Small or moderate hiatal hernia containing gas. There is no edema, consolidation, effusion, or pneumothorax. IMPRESSION: No evidence of acute disease. Electronically Signed   By: Monte Fantasia M.D.   On: 09/13/2020 07:58    EKG: Independently reviewed.  Sinus bradycardia normal axis nonspecific T wave changes  Assessment/Plan Hypothermia Pretty interesting case, she relates her heater is working at home she has not been started on any new medications or changes in her medications, she has not taking any over-the-counter medications. She denies any alcohol use, she does not appear to be malnourished and she denies any toxins She has not gotten burn, she was noted oral hypoglycemic agent she has not had any trauma or has been diagnosed with any central nervous disease like a stroke or Parkinson's.  She denies any abdominal pain. At this point in time we will start her on aggressive IV fluid hydration, her TSH was checked which shows borderline high, this will need to be rechecked in 6 weeks as an outpatient. We will not start her on empiric antibiotics, UA and blood cultures have been sent. Her cardiac biomarkers have basically remained flat, she has no leukocytosis and her chest x-ray appears to be clean. This could be environmental, but also check cortisol level in the morning although she is not hyponatremic or hypoglycemic. BUN and creatinine appears to be at baseline as well as her hemoglobin platelets and white blood cell count, her lactate is 1.9. Check a CK and a lipase. We will continue aggressive IV fluid hydration and reevaluate in the morning.  We will get physical therapy to evaluate her. Also her blood pressure reaches above 94 we could discontinue the [facility].  Sinus  bradycardia Due to hypothermia.  Admit her to telemetry, her heart rate is coming up nicely.  Essential hypertension Hold antihypertensive medication at this point will continue aggressive IV fluid hydration.    DVT Prophylaxis: Lovenox Code Status: Full Family Communication: None Disposition Plan: Inpatient  Time spent: 8 minutes   Charlynne Cousins MD [facility]   09/13/2020, 2:01 PM

## 2020-09-13 NOTE — ED Notes (Signed)
Unable to obtain temperature, pt feels cool to the touch but denies being cold., a/ox4, resp e/u, nad. Charge RN made aware.

## 2020-09-13 NOTE — ED Provider Notes (Signed)
[facility] EMERGENCY DEPARTMENT Provider Note   CSN: [id number] Arrival date & time: 09/13/20  0717     History Chief Complaint  Patient presents with  . Chest Pain    [name] is a 75 y.o. female.  75 yo F with a chief complaints of chest pain.  She has trouble describing this.  Describes it as a discomfort off to the left side of her chest.  Worse with sitting up.  She is unsure if anything else makes it worse.  Denies cough congestion or fever denies trauma denies diaphoresis nausea or vomiting.  No specific exertional symptoms.  Denies abdominal pain.  Denies history of MI.  Denies history of PE or DVT.  The history is provided by the patient.  Chest Pain Pain location:  L chest Pain quality: aching   Pain radiates to:  Does not radiate Pain severity:  Moderate Onset quality:  Gradual Duration:  2 days Timing:  Constant Progression:  Worsening Chronicity:  New Relieved by:  Nothing Worsened by:  Nothing Ineffective treatments:  None tried Associated symptoms: shortness of breath   Associated symptoms: no dizziness, no fever, no headache, no nausea, no palpitations and no vomiting        Past Medical History:  Diagnosis Date  . Hypertension     There are no problems to display for this patient.   Past Surgical History:  Procedure Laterality Date  . MASTECTOMY       OB History   No obstetric history on file.     No family history on file.  Social History   Tobacco Use  . Smoking status: Current Every Day Smoker    Packs/day: 0.40    Years: 20.00    Pack years: 8.00    Types: Cigarettes    Home Medications Prior to Admission medications   Medication Sig Start Date End Date Taking? Authorizing Provider  amLODipine-valsartan (EXFORGE) 10-160 MG per tablet Take 1 tablet by mouth daily. 08/09/14   Janne Napoleon, NP    Allergies    Shellfish allergy  Review of Systems   Review of Systems  Constitutional: Negative for  chills and fever.  HENT: Negative for congestion and rhinorrhea.   Eyes: Negative for redness and visual disturbance.  Respiratory: Positive for shortness of breath. Negative for wheezing.   Cardiovascular: Positive for chest pain. Negative for palpitations.  Gastrointestinal: Negative for nausea and vomiting.  Genitourinary: Negative for dysuria and urgency.  Musculoskeletal: Negative for arthralgias and myalgias.  Skin: Negative for pallor and wound.  Neurological: Negative for dizziness and headaches.    Physical Exam Updated Vital Signs BP (!) 90/57   Pulse (!) 58   Temp (!) 90.4 F (32.4 C) (Rectal)   Resp 11   Ht 5\' 6"  (1.676 m)   Wt 96.6 kg   SpO2 97%   BMI 34.38 kg/m   Physical Exam Vitals and nursing note reviewed.  Constitutional:      General: She is not in acute distress.    Appearance: She is well-developed and well-nourished. She is not diaphoretic.  HENT:     Head: Normocephalic and atraumatic.  Eyes:     Extraocular Movements: EOM normal.     Pupils: Pupils are equal, round, and reactive to light.  Cardiovascular:     Rate and Rhythm: Normal rate and regular rhythm.     Heart sounds: No murmur heard. No friction rub. No gallop.   Pulmonary:  Effort: Pulmonary effort is normal.     Breath sounds: No wheezing or rales.  Chest:     Chest wall: No tenderness.  Abdominal:     General: There is no distension.     Palpations: Abdomen is soft.     Tenderness: There is no abdominal tenderness.  Musculoskeletal:        General: No tenderness or edema.     Cervical back: Normal range of motion and neck supple.  Skin:    General: Skin is warm and dry.  Neurological:     Mental Status: She is alert and oriented to person, place, and time.  Psychiatric:        Mood and Affect: Mood and affect normal.        Behavior: Behavior normal.     ED Results / Procedures / Treatments   Labs (all labs ordered are listed, but only abnormal results are  displayed) Labs Reviewed  BASIC METABOLIC PANEL - Abnormal; Notable for the following components:      Result Value   CO2 21 (*)    Glucose, Bld 139 (*)    BUN 25 (*)    Anion gap 16 (*)    All other components within normal limits  CBC - Abnormal; Notable for the following components:   RBC 5.26 (*)    All other components within normal limits  TSH - Abnormal; Notable for the following components:   TSH 4.561 (*)    All other components within normal limits  T4, FREE - Abnormal; Notable for the following components:   Free T4 1.20 (*)    All other components within normal limits  CULTURE, BLOOD (ROUTINE X 2)  CULTURE, BLOOD (ROUTINE X 2)  SARS CORONAVIRUS 2 BY RT PCR (HOSPITAL ORDER, [facility])  LACTIC ACID, PLASMA  LACTIC ACID, PLASMA  URINALYSIS, ROUTINE W REFLEX MICROSCOPIC  TROPONIN I (HIGH SENSITIVITY)  TROPONIN I (HIGH SENSITIVITY)    EKG EKG Interpretation  Date/Time:  Tuesday September 13 2020 07:25:47 EST Ventricular Rate:  46 PR Interval:  156 QRS Duration: 90 QT Interval:  510 QTC Calculation: 446 R Axis:   10 Text Interpretation: Sinus bradycardia Septal infarct , age undetermined ST & T wave abnormality, consider lateral ischemia Abnormal ECG no acute STEMI no prior for comparison Confirmed by Madalyn Rob [phone]) on 09/13/2020 8:21:27 AM   Radiology DG Chest 2 View  Result Date: 09/13/2020 CLINICAL DATA:  Chest pain EXAM: CHEST - 2 VIEW COMPARISON:  01/17/2005 FINDINGS: Postoperative right axilla.  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 Normal heart size and stable aortic tortuosity. Small or moderate hiatal hernia containing gas. There is no edema, consolidation, effusion, or pneumothorax. IMPRESSION: No evidence of acute disease. Electronically Signed   By: Monte Fantasia M.D.   On: 09/13/2020 07:58    Procedures Procedures   Medications Ordered in ED Medications  sodium chloride 0.9 % bolus 1,000 mL (has no administration in  time range)  cefTRIAXone (ROCEPHIN) 1 g in sodium chloride 0.9 % 100 mL IVPB (has no administration in time range)  sodium chloride 0.9 % bolus 1,000 mL (1,000 mLs Intravenous New Bag/Given 09/13/20 1133)    ED Course  I have reviewed the triage vital signs and the nursing notes.  Pertinent labs & imaging results that were available during my care of the patient were reviewed by me and considered in my medical decision making (see chart for details).    MDM Rules/Calculators/A&P  75 yo F with a chief complaints of chest pain.  She has trouble describing this.  Going on for a few days.  Seems to be worse with sitting up but she is unable to describe any other thing that might make it worse.  Not specifically exertional though she has not been very active in the past few days.  Her temperature is unmeasurable here and her blood pressure is quite low on arrival.  She tells me that is not uncommon for her with the blood pressure medicine that she is on after she had had a cerebral aneurysm.  Given IV fluids with some improvement of her blood pressure.  She is also bradycardic on arrival.  Send off thyroid studies.  Initial troponin is negative.  Second Trop is negative.  Patient has persistent hypothermia.  Placed on the bear hugger.  Had some improvement of her blood pressure with IV fluids.  Patient still not endorsing any infectious symptoms.  She does feel like her house is a bit cold.  I will give her a dose of Rocephin.  Will discuss with medicine for possible admission.  CRITICAL CARE Performed by: Cecilio Asper   Total critical care time: 35 minutes  Critical care time was exclusive of separately billable procedures and treating other patients.  Critical care was necessary to treat or prevent imminent or life-threatening deterioration.  Critical care was time spent personally by me on the following activities: development of treatment plan with patient  and/or surrogate as well as nursing, discussions with consultants, evaluation of patient's response to treatment, examination of patient, obtaining history from patient or surrogate, ordering and performing treatments and interventions, ordering and review of laboratory studies, ordering and review of radiographic studies, pulse oximetry and re-evaluation of patient's condition.   The patients results and plan were reviewed and discussed.   Any x-rays performed were independently reviewed by myself.   Differential diagnosis were considered with the presenting HPI.  Medications  sodium chloride 0.9 % bolus 1,000 mL (has no administration in time range)  cefTRIAXone (ROCEPHIN) 1 g in sodium chloride 0.9 % 100 mL IVPB (has no administration in time range)  sodium chloride 0.9 % bolus 1,000 mL (1,000 mLs Intravenous New Bag/Given 09/13/20 1133)    Vitals:   09/13/20 1210 09/13/20 1215 09/13/20 1220 09/13/20 1223  BP:    (!) 90/57  Pulse: 60 (!) 59 (!) 58 (!) 58  Resp: 11 11 10 11   Temp:      TempSrc:      SpO2: 99% 98% 98% 97%  Weight:      Height:        Final diagnoses:  Hypothermia, initial encounter  Nonspecific chest pain    Admission/ observation were discussed with the admitting physician, patient and/or family and they are comfortable with the plan.   Final Clinical Impression(s) / ED Diagnoses Final diagnoses:  Hypothermia, initial encounter  Nonspecific chest pain    Rx / DC Orders ED Discharge Orders    None       Deno Etienne, DO 09/13/20 1337

## 2020-09-13 NOTE — ED Notes (Signed)
Dinner Tray Ordered @ 1651. 

## 2020-09-14 ENCOUNTER — Observation Stay (HOSPITAL_COMMUNITY): Payer: Medicare Other

## 2020-09-14 DIAGNOSIS — R001 Bradycardia, unspecified: Secondary | ICD-10-CM | POA: Diagnosis present

## 2020-09-14 DIAGNOSIS — F17210 Nicotine dependence, cigarettes, uncomplicated: Secondary | ICD-10-CM | POA: Diagnosis present

## 2020-09-14 DIAGNOSIS — I959 Hypotension, unspecified: Secondary | ICD-10-CM | POA: Diagnosis present

## 2020-09-14 DIAGNOSIS — Z20822 Contact with and (suspected) exposure to covid-19: Secondary | ICD-10-CM | POA: Diagnosis present

## 2020-09-14 DIAGNOSIS — Z8249 Family history of ischemic heart disease and other diseases of the circulatory system: Secondary | ICD-10-CM | POA: Diagnosis not present

## 2020-09-14 DIAGNOSIS — Z9010 Acquired absence of unspecified breast and nipple: Secondary | ICD-10-CM | POA: Diagnosis not present

## 2020-09-14 DIAGNOSIS — X31XXXA Exposure to excessive natural cold, initial encounter: Secondary | ICD-10-CM | POA: Diagnosis not present

## 2020-09-14 DIAGNOSIS — T68XXXA Hypothermia, initial encounter: Principal | ICD-10-CM

## 2020-09-14 DIAGNOSIS — I10 Essential (primary) hypertension: Secondary | ICD-10-CM

## 2020-09-14 DIAGNOSIS — R0789 Other chest pain: Secondary | ICD-10-CM | POA: Diagnosis present

## 2020-09-14 DIAGNOSIS — Z79899 Other long term (current) drug therapy: Secondary | ICD-10-CM | POA: Diagnosis not present

## 2020-09-14 DIAGNOSIS — E876 Hypokalemia: Secondary | ICD-10-CM | POA: Diagnosis present

## 2020-09-14 DIAGNOSIS — Z91013 Allergy to seafood: Secondary | ICD-10-CM | POA: Diagnosis not present

## 2020-09-14 DIAGNOSIS — T68XXXD Hypothermia, subsequent encounter: Secondary | ICD-10-CM | POA: Diagnosis not present

## 2020-09-14 LAB — CBC
HCT: 34.4 % — ABNORMAL LOW (ref 36.0–46.0)
Hemoglobin: 11.5 g/dL — ABNORMAL LOW (ref 12.0–15.0)
MCH: 27.3 pg (ref 26.0–34.0)
MCHC: 33.4 g/dL (ref 30.0–36.0)
MCV: 81.7 fL (ref 80.0–100.0)
Platelets: 167 10*3/uL (ref 150–400)
RBC: 4.21 MIL/uL (ref 3.87–5.11)
RDW: 14.7 % (ref 11.5–15.5)
WBC: 6.5 10*3/uL (ref 4.0–10.5)
nRBC: 0 % (ref 0.0–0.2)

## 2020-09-14 LAB — RAPID URINE DRUG SCREEN, HOSP PERFORMED
Amphetamines: NOT DETECTED
Barbiturates: NOT DETECTED
Benzodiazepines: NOT DETECTED
Cocaine: NOT DETECTED
Opiates: NOT DETECTED
Tetrahydrocannabinol: NOT DETECTED

## 2020-09-14 LAB — COMPREHENSIVE METABOLIC PANEL
ALT: 19 U/L (ref 0–44)
AST: 31 U/L (ref 15–41)
Albumin: 2.7 g/dL — ABNORMAL LOW (ref 3.5–5.0)
Alkaline Phosphatase: 86 U/L (ref 38–126)
Anion gap: 12 (ref 5–15)
BUN: 22 mg/dL (ref 8–23)
CO2: 20 mmol/L — ABNORMAL LOW (ref 22–32)
Calcium: 8.2 mg/dL — ABNORMAL LOW (ref 8.9–10.3)
Chloride: 107 mmol/L (ref 98–111)
Creatinine, Ser: 0.82 mg/dL (ref 0.44–1.00)
GFR, Estimated: >60 mL/min (ref >60)
Glucose, Bld: 64 mg/dL — ABNORMAL LOW (ref 70–99)
Potassium: 3.6 mmol/L (ref 3.5–5.1)
Sodium: 139 mmol/L (ref 135–145)
Total Bilirubin: 0.9 mg/dL (ref 0.3–1.2)
Total Protein: 5.6 g/dL — ABNORMAL LOW (ref 6.5–8.1)

## 2020-09-14 LAB — CORTISOL: Cortisol, Plasma: 33 ug/dL

## 2020-09-14 LAB — VITAMIN B12: Vitamin B-12: 206 pg/mL (ref 180–914)

## 2020-09-14 LAB — AMMONIA: Ammonia: 31 umol/L (ref 9–35)

## 2020-09-14 LAB — FOLATE: Folate: 11 ng/mL (ref >5.9)

## 2020-09-14 NOTE — ED Notes (Signed)
Pt's son updated.

## 2020-09-14 NOTE — ED Notes (Signed)
Spoke to pts son he states mom is normally alert and orientated. Pts son is concerned because his mother is sensitive to medicine and does not do well with hospital visits. Pt reports speaking to mom at 1:00 am and reports mom was not making sense. Md notified.

## 2020-09-14 NOTE — ED Notes (Signed)
Pt is climbing out of bed with her legs dangling in between railings. Pt refuses to taker pants off for in and out cath, and states she is leaving

## 2020-09-14 NOTE — ED Notes (Addendum)
This RN called tried to call report. Secretary took my number and said that nurse would call me back.

## 2020-09-14 NOTE — Progress Notes (Signed)
PROGRESS NOTE    [name]  [id number] DOB: Aug 27, 1945 DOA: 09/13/2020 PCP: Patient, No Pcp Per   Brief Narrative:  HPI: [name] is a 75 y.o. female past medical history of essential hypertension the hospital for atypical chest pain she really cannot describe, she relates she was in bed when she had it nothing makes it better or worse she denies any associated symptoms.  She relates no new medications or over-the-counter medications.  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  She has not been started on any oral hypoglycemic agents no recent trauma. In the ED: She was moderately hypothermic degrees Celsius mildly bradycardic 40s to 50s and mildly hypotensive saturations  than 100% on room air, her sodium was 138 he ate glucose was 139, she relates no sores or cuts her white count is 4.2, TSH is 4.5 and chest x-ray shows no acute abnormalities   Assessment & Plan:   Active Problems:   Hypothermia   Sinus bradycardia   Essential hypertension  Hypothermia: Patient presented with an episode of hypothermia.  Her temperature has resolved.  No source identified.  Patient is back to normal.  Chest pain: Patient came in with atypical chest pain.  Cardiac enzymes negative.  No more chest pain currently.  Sinus bradycardia: Resolved.  Essential hypertension: Blood pressure within normal range.  Continue to hold antihypertensives.  Possible early signs of dementia: When talking to patient, patient would giggle and smile for no reason.  She was able to answer the current date, month and year as well as current POTUS however she missed that she was in the hospital, she thought she was at a nursing home.  According to the patient's primary nurse, patient could not answer any of those questions earlier and she was redirected.  When I saw her however, she was alert and oriented as mentioned above.  Weakness: Patient  was seen by PT OT who recommended discharging home with 24-hour supervision or discharging to SNF.  TOC has been consulted however it appears that patient does not want to go to SNF.  I called patient's son Glendell Docker over the phone as a courtesy to provide him update and to find out if patient will have 24-hour supervision. Glendell Docker was upset of the bat for some unknown reasons.  When I had explained to him that his mother had some early signs of dementia and that she could not figure out where she was at, he became more upset and started accusing that this is exactly the same thing that happened to her mother when she was admitted last time.  He started accusing that hospital or nurses have given some medications to the patient which is causing her altered mental status.  He claims that patient is very independent, she has never had any altered mental status and that she actually drives and in fact drives him around as well.  He was not willing to listen at all.  I talked to him for approximately 15 minutes over the phone while he did not allow me to talk much.  The longer the call continued, the more the upset and louder he became.  He was very rude over the phone.  At the end, he asked me where his mom is and he was informed about the room number and the location.  He said " I am coming to the hospital and holler at the people  over there" and eventually hung up the phone.  Patient is not safe to go home since she does not have 24-hour supervision.  DVT prophylaxis: heparin injection 5,000 Units Start: 09/13/20 2200   Code Status: Full Code  Family Communication: None present at bedside.  Discussion with son as above.  Status is: Observation  The patient will require care spanning > 2 midnights and should be moved to inpatient because: Unsafe d/c plan  Dispo: The patient is from: Home              Anticipated d/c is to: Home              Anticipated d/c date is: 1 day              Patient currently is not  medically stable to d/c.   Difficult to place patient No        Estimated body mass index is 34.38 kg/m as calculated from the following:   Height as of this encounter: 5\' 6"  (1.676 m).   Weight as of this encounter: 96.6 kg.      Nutritional status:               Consultants:   None  Procedures:   None  Antimicrobials:  Anti-infectives (From admission, onward)   Start     Dose/Rate Route Frequency Ordered Stop   09/13/20 1315  cefTRIAXone (ROCEPHIN) 1 g in sodium chloride 0.9 % 100 mL IVPB  Status:  Discontinued        1 g 200 mL/hr over 30 Minutes Intravenous  Once 09/13/20 1304 09/13/20 1342         Subjective: Patient seen and examined early this morning.  She was sitting at the edge of the bed, smiling and had no complaint.  Objective: Vitals:   09/14/20 0800 09/14/20 0815 09/14/20 0830 09/14/20 0915  BP: (!) 114/92 126/67 106/67 115/71  Pulse: 67 68 70 65  Resp: 20 14 (!) 22 14  Temp:      TempSrc:      SpO2: 96% 96% 95% 96%  Weight:      Height:        Intake/Output Summary (Last 24 hours) at 09/14/2020 1224 Last data filed at 09/13/2020 1729 Gross per 24 hour  Intake 2000 ml  Output --  Net 2000 ml   Filed Weights   09/13/20 0728  Weight: 96.6 kg    Examination:  General exam: Appears calm and comfortable  Respiratory system: Clear to auscultation. Respiratory effort normal. Cardiovascular system: S1 & S2 heard, RRR. No JVD, murmurs, rubs, gallops or clicks. No pedal edema. Gastrointestinal system: Abdomen is nondistended, soft and nontender. No organomegaly or masses felt. Normal bowel sounds heard. Central nervous system: Alert and oriented. No focal neurological deficits. Extremities: Symmetric 5 x 5 power. Skin: No rashes, lesions or ulcers Psychiatry: Judgement and insight appear poor   Data Reviewed: I have personally reviewed following labs and imaging studies  CBC: Recent Labs  Lab 09/13/20 0730 09/13/20 2112  09/14/20 0621  WBC 4.2 4.8 6.5  HGB 14.0 12.3 11.5*  HCT 44.4 37.5 34.4*  MCV 84.4 81.2 81.7  PLT 231 224 161   Basic Metabolic Panel: Recent Labs  Lab 09/13/20 0730 09/13/20 2112 09/14/20 0621  NA 138  --  139  K 3.6  --  3.6  CL 101  --  107  CO2 21*  --  20*  GLUCOSE 139*  --  64*  BUN 25*  --  22  CREATININE 0.87 0.90 0.82  CALCIUM 9.7  --  8.2*   GFR: Estimated Creatinine Clearance: 72.6 mL/min (by C-G formula based on SCr of 0.82 mg/dL). Liver Function Tests: Recent Labs  Lab 09/14/20 0621  AST 31  ALT 19  ALKPHOS 86  BILITOT 0.9  PROT 5.6*  ALBUMIN 2.7*   Recent Labs  Lab 09/13/20 1417  LIPASE 19   Recent Labs  Lab 09/14/20 0643  AMMONIA 31   Coagulation Profile: No results for input(s): INR, PROTIME in the last 168 hours. Cardiac Enzymes: Recent Labs  Lab 09/13/20 1417  CKTOTAL 102   BNP (last 3 results) No results for input(s): PROBNP in the last 8760 hours. HbA1C: No results for input(s): HGBA1C in the last 72 hours. CBG: No results for input(s): GLUCAP in the last 168 hours. Lipid Profile: No results for input(s): CHOL, HDL, LDLCALC, TRIG, CHOLHDL, LDLDIRECT in the last 72 hours. Thyroid Function Tests: Recent Labs    09/13/20 0926  TSH 4.561*  FREET4 1.20*   Anemia Panel: Recent Labs    09/13/20 0926  VITAMINB12 206  FOLATE 11.0   Sepsis Labs: Recent Labs  Lab 09/13/20 0926  LATICACIDVEN 1.9    Recent Results (from the past 240 hour(s))  SARS Coronavirus 2 by RT PCR (hospital order, performed in [facility] hospital lab) Nasopharyngeal Nasopharyngeal Swab     Status: None   Collection Time: 09/13/20  1:12 PM   Specimen: Nasopharyngeal Swab  Result Value Ref Range Status   SARS Coronavirus 2 NEGATIVE NEGATIVE Final    Comment: (NOTE) SARS-CoV-2 target nucleic acids are NOT DETECTED.  The SARS-CoV-2 RNA is generally detectable in upper and lower respiratory specimens during the acute phase of infection. The  lowest concentration of SARS-CoV-2 viral copies this assay can detect is 250 copies / mL. A negative result does not preclude SARS-CoV-2 infection and should not be used as the sole basis for treatment or other patient management decisions.  A negative result may occur with improper specimen collection / handling, submission of specimen other than nasopharyngeal swab, presence of viral mutation(s) within the areas targeted by this assay, and inadequate number of viral copies (<250 copies / mL). A negative result must be combined with clinical observations, patient history, and epidemiological information.  Fact Sheet for Patients:   StrictlyIdeas.no  Fact Sheet for Healthcare Providers: BankingDealers.co.za  This test is not yet approved or  cleared by the [place] FDA and has been authorized for detection and/or diagnosis of SARS-CoV-2 by FDA under an Emergency Use Authorization (EUA).  This EUA will remain in effect (meaning this test can be used) for the duration of the COVID-19 declaration under Section 564(b)(1) of the Act, 21 U.S.C. section 360bbb-3(b)(1), unless the authorization is terminated or revoked sooner.  Performed at [facility], [place] [address], [place], [place] [address]   Blood culture (routine x 2)     Status: None (Preliminary result)   Collection Time: 09/13/20  1:34 PM   Specimen: BLOOD LEFT ARM  Result Value Ref Range Status   Specimen Description BLOOD LEFT ARM  Final   Special Requests   Final    BOTTLES DRAWN AEROBIC ONLY Blood Culture adequate volume   Culture   Final    NO GROWTH < 24 HOURS Performed at [facility], [place] [address], [place], [place] [address]    Report Status PENDING  Incomplete  Blood culture (routine x  2)     Status: None (Preliminary result)   Collection Time: 09/13/20  1:35 PM   Specimen: BLOOD LEFT HAND  Result Value Ref Range Status   Specimen Description  BLOOD LEFT HAND  Final   Special Requests   Final    BOTTLES DRAWN AEROBIC AND ANAEROBIC Blood Culture results may not be optimal due to an inadequate volume of blood received in culture bottles   Culture   Final    NO GROWTH < 24 HOURS Performed at [facility], [place] [address] [place], [place] [address]    Report Status PENDING  Incomplete      Radiology Studies: DG Chest 2 View  Result Date: 09/13/2020 CLINICAL DATA:  Chest pain EXAM: CHEST - 2 VIEW COMPARISON:  01/17/2005 FINDINGS: Postoperative right axilla.  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 Normal heart size and stable aortic tortuosity. Small or moderate hiatal hernia containing gas. There is no edema, consolidation, effusion, or pneumothorax. IMPRESSION: No evidence of acute disease. Electronically Signed   By: Monte Fantasia M.D.   On: 09/13/2020 07:58   CT HEAD WO CONTRAST  Result Date: 09/14/2020 CLINICAL DATA:  Delirium EXAM: CT HEAD WITHOUT CONTRAST TECHNIQUE: Contiguous axial images were obtained from the base of the skull through the vertex without intravenous contrast. COMPARISON:  07/30/2007 brain MRI FINDINGS: Brain: No evidence of acute infarction, hemorrhage, hydrocephalus, extra-axial collection or mass lesion/mass effect. 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 Small remote cortical infarcts at the high right frontal parietal convexity and in the deep left cerebral white matter. Vascular: A-comm region aneurysm coiling. Skull: Negative Sinuses/Orbits: No acute finding. Partial coverage of prior right mid face metallic suture IMPRESSION: 1. No acute finding or change from 2008. 2. Remote infarcts as described. Electronically Signed   By: Monte Fantasia M.D.   On: 09/14/2020 04:15    Scheduled Meds: . heparin  5,000 Units Subcutaneous Q8H   Continuous Infusions: . sodium chloride 75 mL/hr at 09/13/20 2107     LOS: 0 days   Time spent: 38 minutes   Darliss Cheney, MD [facility]  09/14/2020, 12:24 PM   To contact the attending provider between 7A-7P or the covering provider during after hours 7P-7A, please log into the web site [email or web address].

## 2020-09-14 NOTE — Evaluation (Signed)
Physical Therapy Evaluation Patient Details Name: [name] MRN: [id number] DOB: 11/16/1945 Today's Date: 09/14/2020   History of Present Illness  Pt is a 75 y/o female admitted secondary to hypothermia and chest pain. PMH includes HTN.  Clinical Impression  Pt admitted secondary to problem above with deficits below. Pt with slowed processing and disoriented to place and situation. Very slowed processing and would take pauses in gait without explanation. Min A for steadying during gait with cane. LOB X2. Feel pt is at increased risk for falls and will require 24/7 assist at home. Reports she has a son, but unsure of how much assist son can provide. If pt does not have 24/7, will need SNF placement. Will continue to follow acutely to maximize functional mobility independence and safety.     Follow Up Recommendations Home health PT; SNF; Supervision/Assistance - 24 hour (Aide; if does not have 24/7, will need SNF placement)    Equipment Recommendations  None recommended by PT    Recommendations for Other Services       Precautions / Restrictions Precautions Precautions: Fall Restrictions Weight Bearing Restrictions: No      Mobility  Bed Mobility Overal bed mobility: Needs Assistance Bed Mobility: Supine to Sit;Sit to Supine     Supine to sit: Mod assist Sit to supine: Supervision   General bed mobility comments: Mod A for trunk elevation to come to sitting on stretcher. Cues for sequencing.    Transfers Overall transfer level: Needs assistance Equipment used: Straight cane Transfers: Sit to/from Stand Sit to Stand: Min assist         General transfer comment: Min A for steadying assist.  Ambulation/Gait Ambulation/Gait assistance: Min assist Gait Distance (Feet): 100 Feet Assistive device: Straight cane Gait Pattern/deviations: Step-through pattern;Decreased stride length;Shuffle Gait velocity: Decreased   General Gait Details: Very slow, shuffle type steps.  LOB X2 this session, requiring min A. Pt would pause without explanation. Asked if pt was ok and pt reported "yes".  Stairs            Wheelchair Mobility    Modified [place] (Stroke Patients Only)       Balance Overall balance assessment: Needs assistance Sitting-balance support: No upper extremity supported;Feet supported Sitting balance-Leahy Scale: Fair     Standing balance support: Single extremity supported;During functional activity Standing balance-Leahy Scale: Poor Standing balance comment: reliant on UE and external support                             Pertinent Vitals/Pain Pain Assessment: No/denies pain    Home Living Family/patient expects to be discharged to:: Private residence Living Arrangements: Children Available Help at Discharge: Family Type of Home: House Home Access: Stairs to enter Entrance Stairs-Rails: Left;Right;Can reach both Technical brewer of Steps: 3 Home Layout: One level Home Equipment: Cane - single point;Walker - 2 wheels;Bedside commode;Walker - 4 wheels      Prior Function Level of Independence: Independent with assistive device(s)         Comments: Uses cane for ambulation     Hand Dominance        Extremity/Trunk Assessment   Upper Extremity Assessment Upper Extremity Assessment: Defer to OT evaluation    Lower Extremity Assessment Lower Extremity Assessment: Generalized weakness    Cervical / Trunk Assessment Cervical / Trunk Assessment: Normal  Communication   Communication: No difficulties  Cognition Arousal/Alertness: Awake/alert Behavior During Therapy: WFL for tasks assessed/performed Overall Cognitive  Status: No family/caregiver present to determine baseline cognitive functioning                                 General Comments: Pt disoriented to place and situation. Slowed processing and difficulty with dual tasking.      General Comments General comments (skin  integrity, edema, etc.): No family present. Discussed using RW.    Exercises     Assessment/Plan    PT Assessment Patient needs continued PT services  PT Problem List Decreased strength;Decreased activity tolerance;Decreased balance;Decreased mobility;Decreased knowledge of use of DME;Decreased cognition;Decreased safety awareness;Decreased knowledge of precautions       PT Treatment Interventions DME instruction;Gait training;Functional mobility training;Therapeutic activities;Therapeutic exercise;Balance training;Stair training;Patient/family education    PT Goals (Current goals can be found in the Care Plan section)  Acute Rehab PT Goals Patient Stated Goal: to go home PT Goal Formulation: With patient Time For Goal Achievement: 09/28/20 Potential to Achieve Goals: Good    Frequency Min 3X/week   Barriers to discharge        Co-evaluation               AM-PAC PT "6 Clicks" Mobility  Outcome Measure Help needed turning from your back to your side while in a flat bed without using bedrails?: A Little Help needed moving from lying on your back to sitting on the side of a flat bed without using bedrails?: A Lot Help needed moving to and from a bed to a chair (including a wheelchair)?: A Little Help needed standing up from a chair using your arms (e.g., wheelchair or bedside chair)?: A Little Help needed to walk in hospital room?: A Little Help needed climbing 3-5 steps with a railing? : A Lot 6 Click Score: 16    End of Session Equipment Utilized During Treatment: Gait belt Activity Tolerance: Patient tolerated treatment well Patient left: in bed;with call bell/phone within reach;Other (comment) (sitting on stretcher with OT present) Nurse Communication: Mobility status PT Visit Diagnosis: Unsteadiness on feet (R26.81);Muscle weakness (generalized) (M62.81)    Time: 1975-8832 PT Time Calculation (min) (ACUTE ONLY): 18 min   Charges:   PT Evaluation $PT Eval  Moderate Complexity: 1 Mod          Reuel Derby, PT, DPT  Acute Rehabilitation Services  Pager: [phone] Office: [phone]   Rudean Hitt 09/14/2020, 9:47 AM

## 2020-09-14 NOTE — Progress Notes (Signed)
CSW verbally consulted due to [facility] vs SNF. CSW went to speak with pt at bedside to address further needs. CSW was advised that pt is from home alone. Pt expressed that she has been living alone for "70 years". CSW asked pt if she has support at home in which pt expressed that she has son that comes and checks on her 2 times a week. CSW inquired from pt if she has any other supports in which pt expressed that she has a cousin however pt unable to give CSW name of cousin. CSW discussed with pt PT's recommendation in which pt expressed no desire for SNF placement. CSW asked pt about 24 hour care at home. Pt expressed that her son or cousin would be able to do this for her. Pt also verbalized that she doesn't feel that she needs any help at home as pt expressed that she cooks for herself as well as does other daily task. CSW asked for permission to call son and confirm this in which CSW was given permission.   CSW reached out to son, however no answer and CSW unable to leave contact information as voicemail is not set up.    Virgie Dad Mykenzi Vanzile, MSW, LCSW [facility] [place] [phone]

## 2020-09-14 NOTE — ED Notes (Signed)
Patient transported to CT 

## 2020-09-14 NOTE — ED Notes (Signed)
Patient is eating and drinking well and asking to have the IV off of her. Will hold for now and message MD.

## 2020-09-14 NOTE — ED Notes (Signed)
Breakfast Ordered 

## 2020-09-14 NOTE — ED Notes (Signed)
Pt sitting in chair at bedside. This RN offered to help pt back in bed and pt said she was fine in the chair. Pt reports no pain at this time.

## 2020-09-14 NOTE — ED Notes (Signed)
Please call son with an update [phone]

## 2020-09-14 NOTE — ED Notes (Signed)
Patient care assumed by this RN. Patient on monitors and per night shift has refused the urine testing and will not remove her pants. Will attempt to collect sample this morning if patient is willing.

## 2020-09-14 NOTE — ED Notes (Signed)
Pts neuro status in question. Pt refuses to open mouth when taking temp, laughs rather than answering questions, and smiles. Md notified.

## 2020-09-14 NOTE — ED Notes (Signed)
Pt refused to bladder scanner. Stated:"Is done, is done, I don't want it"

## 2020-09-14 NOTE — ED Notes (Signed)
Patient dressed herself in house coat and still wearing her shoes, walked with cane to the ED exit and wanted to leave. Patient guided back to room and son came and met her in the room. Patient was able to be persuaded after much conversation w tech and RN to get back in bed and eat her lunch tray. She is tolerating food well and watching her stories on the TV in room. Patient still slightly disoriented, but very calm.

## 2020-09-14 NOTE — Evaluation (Signed)
Occupational Therapy Evaluation Patient Details Name: [name] MRN: [id number] DOB: 1946-02-21 Today's Date: 09/14/2020    History of Present Illness Pt is a 75 y/o female admitted secondary to hypothermia and chest pain. PMH includes HTN.   Clinical Impression   Pt PTA: Pt living alone, reports independence, but per chart- pt was found hypothermic and possibly with the heat off in home. Pt reports driving to grocery store. Pt currently, ModA for bed mobility and minA for ambulation in hallway and in room. Pt requires directions repeated in order to complete 1 step task. Pt stating "November" as current month and unable to describe reason for hospitalization. Pt can perform ADL tasks, but requires supervisionA for safety. Pt unable to complete Short Blessed  Test as this time as pt became irritated during the "quiz." Pt would benefit from continued OT skilled services for ADL, mobility and safety. OT following acutely.     Follow Up Recommendations  Home health OT;Supervision/Assistance - 24 hour;SNF (may require SNF if 24/7 not available; safety concern without 24/7)    Equipment Recommendations  None recommended by OT    Recommendations for Other Services       Precautions / Restrictions Precautions Precautions: Fall Restrictions Weight Bearing Restrictions: No      Mobility Bed Mobility Overal bed mobility: Needs Assistance Bed Mobility: Supine to Sit;Sit to Supine     Supine to sit: Mod assist Sit to supine: Mod assist   General bed mobility comments: Mod A for trunk elevation to come to sitting on stretcher. Cues for sequencing. Pt sit to supine x2 times and requireing superivision x1 time and modA for 2nd time for BLEs.    Transfers Overall transfer level: Needs assistance Equipment used: Straight cane Transfers: Sit to/from Stand Sit to Stand: Min assist         General transfer comment: Min A for steadying assist.    Balance Overall balance  assessment: Needs assistance Sitting-balance support: No upper extremity supported;Feet supported Sitting balance-Leahy Scale: Fair     Standing balance support: Single extremity supported;During functional activity Standing balance-Leahy Scale: Poor Standing balance comment: reliant on UE and external support                           ADL either performed or assessed with clinical judgement   ADL Overall ADL's : At baseline                                       General ADL Comments: Pt not requires supervisionA for all ADL/iADL for safety.     Vision Baseline Vision/History: Wears glasses Wears Glasses: Reading only Patient Visual Report: No change from baseline Vision Assessment?: No apparent visual deficits     Perception     Praxis      Pertinent Vitals/Pain Pain Assessment: No/denies pain     Hand Dominance Right   Extremity/Trunk Assessment Upper Extremity Assessment Upper Extremity Assessment: Generalized weakness   Lower Extremity Assessment Lower Extremity Assessment: Generalized weakness;Defer to PT evaluation   Cervical / Trunk Assessment Cervical / Trunk Assessment: Normal   Communication Communication Communication: No difficulties   Cognition Arousal/Alertness: Awake/alert Behavior During Therapy: WFL for tasks assessed/performed Overall Cognitive Status: No family/caregiver present to determine baseline cognitive functioning  General Comments: Pt disoriented to time, place and situation. Slowed processing and difficulty with dual tasking. Pt stating "November" for month. Pt unable to complete Short blessed test for memory and concentration as pt irritated by questions. Pt unware of deficits.   General Comments  no family present    Exercises     Shoulder Instructions      Home Living Family/patient expects to be discharged to:: Private residence Living Arrangements:  Children Available Help at Discharge: Family Type of Home: House Home Access: Stairs to enter Technical brewer of Steps: 3 Entrance Stairs-Rails: Left;Right;Can reach both Home Layout: One level     Bathroom Shower/Tub: Teacher, early years/pre: [place] - single point;Walker - 2 wheels;Bedside commode;Walker - 4 wheels;Shower seat          Prior Functioning/Environment Level of Independence: Independent with assistive device(s)        Comments: Uses cane for ambulation        OT Problem List: Decreased strength;Decreased activity tolerance;Impaired balance (sitting and/or standing);Decreased cognition;Decreased safety awareness      OT Treatment/Interventions: Self-care/ADL training;Therapeutic exercise;Energy conservation;Therapeutic activities;Cognitive remediation/compensation;Patient/family education;Balance training    OT Goals(Current goals can be found in the care plan section) Acute Rehab OT Goals Patient Stated Goal: to go home OT Goal Formulation: Patient unable to participate in goal setting Time For Goal Achievement: 09/28/20 Potential to Achieve Goals: Good ADL Goals Pt Will Transfer to Toilet: with supervision;ambulating;regular height toilet Pt Will Perform Toileting - Clothing Manipulation and hygiene: with supervision;sit to/from stand Additional ADL Goal #1: Pt will participate in higher level cognitive task/assessment in order to increase independence. Additional ADL Goal #2: Pt will participate in x10 mins of OOB ADL tasks with supervisionA and 1 seated rest break.  OT Frequency: Min 2X/week   Barriers to D/C: Decreased caregiver support  pt lives alone, but family is in area       Co-evaluation              AM-PAC OT "6 Clicks" Daily Activity     Outcome Measure Help from another person eating meals?: A Little Help from another person taking care of personal grooming?: A Little Help from  another person toileting, which includes using toliet, bedpan, or urinal?: A Little Help from another person bathing (including washing, rinsing, drying)?: A Little Help from another person to put on and taking off regular upper body clothing?: A Little Help from another person to put on and taking off regular lower body clothing?: A Little 6 Click Score: 18   End of Session Equipment Utilized During Treatment: Other (comment) [facility]) Nurse Communication: Mobility status  Activity Tolerance: Patient tolerated treatment well Patient left: in bed;with call bell/phone within reach  OT Visit Diagnosis: Unsteadiness on feet (R26.81);Muscle weakness (generalized) (M62.81);Other symptoms and signs involving cognitive function                Time: 5537-4827 OT Time Calculation (min): 15 min Charges:  OT General Charges $OT Visit: 1 Visit OT Evaluation $OT Eval Moderate Complexity: 1 Mod  Jefferey Pica, OTR/L Acute Rehabilitation Services Pager: [phone] Office: [phone]   Deshauna Cayson C 09/14/2020, 10:08 AM

## 2020-09-14 NOTE — ED Notes (Signed)
Patient assisted out of bed and to stand w minimal assist. Wearing shoes and using her own cane, she walked from one end of unit to the other. Patient voided on toilet and sample collected. She is alert and oriented to person only initially when she woke, but reoriented easily to all the other answers except location. She initially thought she was in her mom's bedroom and then later answered that she was in a nursing home. Patient smiling and calm, no distress and refusing any food or beverage this morning. Patient placed back on monitors and back on clean sheets. Of note, while assisting to the bathroom large area of moisture noted with white exudate present. Area cleaned and dried by RN. Left gluteal fold with some erosion through the skin from the moisture. Patient was wearing diaper from home that had only drops of urine on it. No incontinence. This info relayed to MD and the PT team.

## 2020-09-15 ENCOUNTER — Encounter (HOSPITAL_COMMUNITY): Payer: Self-pay | Admitting: Family Medicine

## 2020-09-15 DIAGNOSIS — I10 Essential (primary) hypertension: Secondary | ICD-10-CM | POA: Diagnosis not present

## 2020-09-15 LAB — CBC WITH DIFFERENTIAL/PLATELET
Abs Immature Granulocytes: 0.02 10*3/uL (ref 0.00–0.07)
Basophils Absolute: 0 10*3/uL (ref 0.0–0.1)
Basophils Relative: 0 %
Eosinophils Absolute: 0.1 10*3/uL (ref 0.0–0.5)
Eosinophils Relative: 1 %
HCT: 34.9 % — ABNORMAL LOW (ref 36.0–46.0)
Hemoglobin: 11.4 g/dL — ABNORMAL LOW (ref 12.0–15.0)
Immature Granulocytes: 0 %
Lymphocytes Relative: 36 %
Lymphs Abs: 2.5 10*3/uL (ref 0.7–4.0)
MCH: 26.6 pg (ref 26.0–34.0)
MCHC: 32.7 g/dL (ref 30.0–36.0)
MCV: 81.4 fL (ref 80.0–100.0)
Monocytes Absolute: 0.8 10*3/uL (ref 0.1–1.0)
Monocytes Relative: 12 %
Neutro Abs: 3.5 10*3/uL (ref 1.7–7.7)
Neutrophils Relative %: 51 %
Platelets: 195 10*3/uL (ref 150–400)
RBC: 4.29 MIL/uL (ref 3.87–5.11)
RDW: 14.6 % (ref 11.5–15.5)
WBC: 6.9 10*3/uL (ref 4.0–10.5)
nRBC: 0 % (ref 0.0–0.2)

## 2020-09-15 LAB — BASIC METABOLIC PANEL
Anion gap: 10 (ref 5–15)
BUN: 19 mg/dL (ref 8–23)
CO2: 25 mmol/L (ref 22–32)
Calcium: 8.9 mg/dL (ref 8.9–10.3)
Chloride: 105 mmol/L (ref 98–111)
Creatinine, Ser: 0.88 mg/dL (ref 0.44–1.00)
GFR, Estimated: >60 mL/min (ref >60)
Glucose, Bld: 71 mg/dL (ref 70–99)
Potassium: 3.3 mmol/L — ABNORMAL LOW (ref 3.5–5.1)
Sodium: 140 mmol/L (ref 135–145)

## 2020-09-15 LAB — MAGNESIUM: Magnesium: 1.7 mg/dL (ref 1.7–2.4)

## 2020-09-15 MED ORDER — POTASSIUM CHLORIDE CRYS ER 20 MEQ PO TBCR
40.0000 meq | EXTENDED_RELEASE_TABLET | Freq: Once | ORAL | Status: AC
  Administered 2020-09-15: 40 meq via ORAL
  Filled 2020-09-15: qty 2

## 2020-09-15 MED ORDER — WHITE PETROLATUM EX OINT
TOPICAL_OINTMENT | CUTANEOUS | Status: AC
  Filled 2020-09-15: qty 28.35

## 2020-09-15 NOTE — Plan of Care (Signed)

## 2020-09-15 NOTE — Progress Notes (Signed)
Physical Therapy Treatment Patient Details Name: [name] MRN: [id number] DOB: 1946/04/26 Today's Date: 09/15/2020    History of Present Illness Pt is a 75 y/o female admitted secondary to hypothermia and chest pain. PMH includes HTN.    PT Comments    Patient received in bed, pleasant and willing to participate in session. Moving a little better today, but still overall in need of MinA for balance and safety due to impulsivity and balance loss which worsened as fatigue increased. Impulsive today- at one point threw herself backwards on bed and onto her side without warning when wound nurse asked to see her skin. Very poor functional problem solving and safety awareness. Left up in recliner with all needs met, chair alarm active.     Follow Up Recommendations  Home health PT;Supervision/Assistance - 24 hour (aide; if does not have 24/7A, may need SNF)     Equipment Recommendations  None recommended by PT    Recommendations for Other Services       Precautions / Restrictions Precautions Precautions: Fall Restrictions Weight Bearing Restrictions: No    Mobility  Bed Mobility Overal bed mobility: Needs Assistance Bed Mobility: Supine to Sit     Supine to sit: Min guard;HOB elevated     General bed mobility comments: increased time and effort, use of railing  Transfers Overall transfer level: Needs assistance Equipment used: None Transfers: Sit to/from Stand Sit to Stand: Min assist         General transfer comment: MInA to boost up and gain balance, posterior lean against bed initially  Ambulation/Gait Ambulation/Gait assistance: Min assist Gait Distance (Feet): 140 Feet Assistive device: None Gait Pattern/deviations: Step-through pattern;Decreased stride length;Shuffle;Narrow base of support Gait velocity: Decreased   General Gait Details: improved gait speed today but now going almost too quickly- imbalance increases with fatigue and she had multiple LOB  anteriorly due to COM being too far forward over her feet.   Stairs             Wheelchair Mobility    Modified [place] (Stroke Patients Only)       Balance Overall balance assessment: Needs assistance Sitting-balance support: No upper extremity supported;Feet supported Sitting balance-Leahy Scale: Good     Standing balance support: Single extremity supported;During functional activity Standing balance-Leahy Scale: Poor Standing balance comment: MinA for dynamic balance                            Cognition Arousal/Alertness: Awake/alert Behavior During Therapy: WFL for tasks assessed/performed Overall Cognitive Status: No family/caregiver present to determine baseline cognitive functioning Area of Impairment: Orientation;Attention;Memory;Following commands;Safety/judgement;Awareness;Problem solving                 Orientation Level: Disoriented to;Place Current Attention Level: Selective Memory: Decreased short-term memory Following Commands: Follows one step commands consistently;Follows one step commands with increased time Safety/Judgement: Decreased awareness of safety;Decreased awareness of deficits Awareness: Intellectual Problem Solving: Slow processing;Decreased initiation;Requires verbal cues General Comments: able to tell me [facility] is at [facility] but insists she is in the ED. Impulsive and not aware of impairments. Recall 2/3 even with max cues for the third item. Had difficulty in using her phone- kept trying to work the screen when it was on sleep mode and could not understand that she had to hit the power button to be able to interact with the screen. Tells me she uses a cane then tells me she does not use any  device.      Exercises      General Comments General comments (skin integrity, edema, etc.): no family present      Pertinent Vitals/Pain Pain Assessment: No/denies pain    Home Living                      Prior Function             PT Goals (current goals can now be found in the care plan section) Acute Rehab PT Goals Patient Stated Goal: to go home PT Goal Formulation: With patient Time For Goal Achievement: 09/28/20 Potential to Achieve Goals: Good Progress towards PT goals: Progressing toward goals    Frequency    Min 3X/week      PT Plan Current plan remains appropriate    Co-evaluation              AM-PAC PT "6 Clicks" Mobility   Outcome Measure  Help needed turning from your back to your side while in a flat bed without using bedrails?: A Little Help needed moving from lying on your back to sitting on the side of a flat bed without using bedrails?: A Little Help needed moving to and from a bed to a chair (including a wheelchair)?: A Little Help needed standing up from a chair using your arms (e.g., wheelchair or bedside chair)?: A Little Help needed to walk in hospital room?: A Little Help needed climbing 3-5 steps with a railing? : A Lot 6 Click Score: 17    End of Session Equipment Utilized During Treatment: Gait belt Activity Tolerance: Patient tolerated treatment well Patient left: in chair;with call bell/phone within reach;with chair alarm set Nurse Communication: Mobility status PT Visit Diagnosis: Unsteadiness on feet (R26.81);Muscle weakness (generalized) (M62.81)     Time: 1540-0867 PT Time Calculation (min) (ACUTE ONLY): 23 min  Charges:  $Gait Training: 8-22 mins $Therapeutic Activity: 8-22 mins                     Windell Norfolk, DPT, PN1   Supplemental Physical Therapist [place]    Pager [phone] Acute Rehab Office [phone]

## 2020-09-15 NOTE — Consult Note (Signed)
[facility] Nurse Consult Note: Reason for Consult: MASD specifically, ITD at the intergluteal cleft, and irritant contact dermatitis in the perineal area secondary to urinary incontinecne Wound type:Moisture associated skin damage (MASD) Pressure Injury POA: N/A Measurement:N/A Wound bed:N/A Drainage (amount, consistency, odor) N/A Periwound:N/A Dressing procedure/placement/frequency: Guidance has been provided to Nursing for the use of InterDry skin fold management product and instructions provided for cleansing and applying our house moisture barrier cream to the buttocks, perineal area and medial thighs and posterior thighs. A pressure redistribution chair pad is provided for patient that is washable and for her to use at home.  [facility] nursing team will not follow, but will remain available to this patient, the nursing and medical teams.  Please re-consult if needed. Thanks, Maudie Flakes, MSN, RN, [place], Arther Abbott  Pager# [phone]

## 2020-09-15 NOTE — Progress Notes (Addendum)
PROGRESS NOTE    [name]  [id number] DOB: [birth year] DOA: 09/13/2020 PCP: Patient, No Pcp Per   Brief Narrative:  HPI: [name] is a 75 y.o. female past medical history of essential hypertension the hospital for atypical chest pain she really cannot describe, she relates she was in bed when she had it nothing makes it better or worse she denies any associated symptoms.  She relates no new medications or over-the-counter medications.  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  She has not been started on any oral hypoglycemic agents no recent trauma. In the ED: She was moderately hypothermic degrees Celsius mildly bradycardic 40s to 50s and mildly hypotensive saturations  than 100% on room air, her sodium was 138 he ate glucose was 139, she relates no sores or cuts her white count is 4.2, TSH is 4.5 and chest x-ray shows no acute abnormalities   Assessment & Plan:   Active Problems:   Hypothermia   Sinus bradycardia   Essential hypertension  Hypothermia: Patient presented with an episode of hypothermia. No source identified.  Patient is back to normal.  Chest pain: Patient came in with atypical chest pain.  Cardiac enzymes negative.  No more chest pain currently.  Sinus bradycardia: Resolved.  Essential hypertension: Blood pressure within normal range.  Continue to hold antihypertensives.  Possible early signs of dementia: Patient has some cognitive issues.  However today she is fully alert and oriented.  She has signs of early dementia which can be observed intermittently.  Hypokalemia: 3.3.  Will replace.  Recheck in the morning.  Generalized weakness: Patient was seen by PT OT who recommended discharging home with 24-hour supervision or discharging to SNF.  Patient does not want to go to SNF and patient's son is not willing to provide 24-hour care.  TOC is on board.  She was reevaluated by PT OT  and they continue to recommend either 24-hour supervision or SNF.  So far we are facing gridlock.  Patient is not safe to go home since she does not have 24-hour supervision.  DVT prophylaxis: heparin injection 5,000 Units Start: 09/13/20 2200   Code Status: Full Code  Family Communication: None present at bedside.  Discussion with son as above.  Status is: Observation  The patient will require care spanning > 2 midnights and should be moved to inpatient because: Unsafe d/c plan  Dispo: The patient is from: Home              Anticipated d/c is to: Home              Anticipated d/c date is: 1 day              Patient currently is medically stable to d/c.   Difficult to place patient yes        Estimated body mass index is 34.38 kg/m as calculated from the following:   Height as of this encounter: 5\' 6"  (1.676 m).   Weight as of this encounter: 96.6 kg.      Nutritional status:               Consultants:   None  Procedures:   None  Antimicrobials:  Anti-infectives (From admission, onward)   Start     Dose/Rate Route Frequency Ordered Stop   09/13/20 1315  cefTRIAXone (ROCEPHIN) 1 g in sodium chloride 0.9 % 100 mL IVPB  Status:  Discontinued        1 g 200 mL/hr over 30 Minutes Intravenous  Once 09/13/20 1304 09/13/20 1342         Subjective: Patient seen and examined.  She has no complaints.  She is alert and oriented today.  Objective: Vitals:   09/14/20 2144 09/14/20 2200 09/15/20 0415 09/15/20 0938  BP: 114/64  137/73 137/73  Pulse: 71  76 76  Resp: 18  20 20   Temp: (!) 97.3 F (36.3 C) 97.9 F (36.6 C) 97.9 F (36.6 C) 98 F (36.7 C)  TempSrc: Oral Oral Oral Oral  SpO2: 99%  96% 96%  Weight:      Height:        Intake/Output Summary (Last 24 hours) at 09/15/2020 1306 Last data filed at 09/15/2020 0900 Gross per 24 hour  Intake 1250 ml  Output 0 ml  Net 1250 ml   Filed Weights   09/13/20 0728  Weight: 96.6 kg     Examination:  General exam: Appears calm and comfortable  Respiratory system: Clear to auscultation. Respiratory effort normal. Cardiovascular system: S1 & S2 heard, RRR. No JVD, murmurs, rubs, gallops or clicks. No pedal edema. Gastrointestinal system: Abdomen is nondistended, soft and nontender. No organomegaly or masses felt. Normal bowel sounds heard. Central nervous system: Alert and oriented. No focal neurological deficits. Extremities: Symmetric 5 x 5 power. Skin: No rashes, lesions or ulcers.  Psychiatry: Judgement and insight appear poor   Data Reviewed: I have personally reviewed following labs and imaging studies  CBC: Recent Labs  Lab 09/13/20 0730 09/13/20 2112 09/14/20 0621 09/15/20 0907  WBC 4.2 4.8 6.5 6.9  NEUTROABS  --   --   --  3.5  HGB 14.0 12.3 11.5* 11.4*  HCT 44.4 37.5 34.4* 34.9*  MCV 84.4 81.2 81.7 81.4  PLT 231 224 167 811   Basic Metabolic Panel: Recent Labs  Lab 09/13/20 0730 09/13/20 2112 09/14/20 0621 09/15/20 0907  NA 138  --  139 140  K 3.6  --  3.6 3.3*  CL 101  --  107 105  CO2 21*  --  20* 25  GLUCOSE 139*  --  64* 71  BUN 25*  --  22 19  CREATININE 0.87 0.90 0.82 0.88  CALCIUM 9.7  --  8.2* 8.9  MG  --   --   --  1.7   GFR: Estimated Creatinine Clearance: 67.7 mL/min (by C-G formula based on SCr of 0.88 mg/dL). Liver Function Tests: Recent Labs  Lab 09/14/20 0621  AST 31  ALT 19  ALKPHOS 86  BILITOT 0.9  PROT 5.6*  ALBUMIN 2.7*   Recent Labs  Lab 09/13/20 1417  LIPASE 19   Recent Labs  Lab 09/14/20 0643  AMMONIA 31   Coagulation Profile: No results for input(s): INR, PROTIME in the last 168 hours. Cardiac Enzymes: Recent Labs  Lab 09/13/20 1417  CKTOTAL 102   BNP (last 3 results) No results for input(s): PROBNP in the last 8760 hours. HbA1C: No results for input(s): HGBA1C in the last 72 hours. CBG: No results for input(s): GLUCAP in the last 168 hours. Lipid Profile: No results for input(s):  CHOL, HDL, LDLCALC, TRIG, CHOLHDL, LDLDIRECT in the last 72 hours. Thyroid Function Tests: Recent Labs    09/13/20 0926  TSH 4.561*  FREET4 1.20*   Anemia Panel: Recent Labs    09/13/20 0926  VITAMINB12 206  FOLATE 11.0   Sepsis Labs: Recent Labs  Lab 09/13/20 0926  LATICACIDVEN 1.9    Recent Results (from the past 240 hour(s))  SARS Coronavirus 2 by RT PCR (hospital order, performed in [facility] hospital lab) Nasopharyngeal Nasopharyngeal Swab     Status: None   Collection Time: 09/13/20  1:12 PM   Specimen: Nasopharyngeal Swab  Result Value Ref Range Status   SARS Coronavirus 2 NEGATIVE NEGATIVE Final    Comment: (NOTE) SARS-CoV-2 target nucleic acids are NOT DETECTED.  The SARS-CoV-2 RNA is generally detectable in upper and lower respiratory specimens during the acute phase of infection. The lowest concentration of SARS-CoV-2 viral copies this assay can detect is 250 copies / mL. A negative result does not preclude SARS-CoV-2 infection and should not be used as the sole basis for treatment or other patient management decisions.  A negative result may occur with improper specimen collection / handling, submission of specimen other than nasopharyngeal swab, presence of viral mutation(s) within the areas targeted by this assay, and inadequate number of viral copies (<250 copies / mL). A negative result must be combined with clinical observations, patient history, and epidemiological information.  Fact Sheet for Patients:   StrictlyIdeas.no  Fact Sheet for Healthcare Providers: BankingDealers.co.za  This test is not yet approved or  cleared by the [place] FDA and has been authorized for detection and/or diagnosis of SARS-CoV-2 by FDA under an Emergency Use Authorization (EUA).  This EUA will remain in effect (meaning this test can be used) for the duration of the COVID-19 declaration under Section 564(b)(1) of  the Act, 21 U.S.C. section 360bbb-3(b)(1), unless the authorization is terminated or revoked sooner.  Performed at [facility], [place] [address] [place], [place] [address]   Blood culture (routine x 2)     Status: None (Preliminary result)   Collection Time: 09/13/20  1:34 PM   Specimen: BLOOD LEFT ARM  Result Value Ref Range Status   Specimen Description BLOOD LEFT ARM  Final   Special Requests   Final    BOTTLES DRAWN AEROBIC ONLY Blood Culture adequate volume   Culture   Final    NO GROWTH 2 DAYS Performed at [facility], [place] [address], [place], [place] [address]    Report Status PENDING  Incomplete  Blood culture (routine x 2)     Status: None (Preliminary result)   Collection Time: 09/13/20  1:35 PM   Specimen: BLOOD LEFT HAND  Result Value Ref Range Status   Specimen Description BLOOD LEFT HAND  Final   Special Requests   Final    BOTTLES DRAWN AEROBIC AND ANAEROBIC Blood Culture results may not be optimal due to an inadequate volume of blood received in culture bottles   Culture   Final    NO GROWTH 2 DAYS Performed at [facility], [place] [address], [place], [place] [address]    Report Status PENDING  Incomplete      Radiology Studies: CT HEAD WO CONTRAST  Result Date: 09/14/2020 CLINICAL DATA:  Delirium EXAM: CT HEAD WITHOUT CONTRAST TECHNIQUE: Contiguous axial images were obtained from the base of the skull through the vertex without intravenous contrast. COMPARISON:  07/30/2007 brain MRI FINDINGS: Brain: No evidence of acute infarction, hemorrhage, hydrocephalus, extra-axial collection or mass lesion/mass effect. 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 Small remote cortical infarcts at the high right frontal parietal convexity and in the deep left cerebral white matter. Vascular: A-comm region aneurysm coiling. Skull: Negative Sinuses/Orbits: No acute  finding. Partial coverage of prior right mid face metallic suture IMPRESSION: 1.  No acute finding or change from 2008. 2. Remote infarcts as described. Electronically Signed   By: Monte Fantasia M.D.   On: 09/14/2020 04:15    Scheduled Meds: . heparin  5,000 Units Subcutaneous Q8H  . potassium chloride  40 mEq Oral Once   Continuous Infusions:    LOS: 1 day   Time spent: 29 minutes   Darliss Cheney, MD [facility]  09/15/2020, 1:06 PM   To contact the attending provider between 7A-7P or the covering provider during after hours 7P-7A, please log into the web site [email or web address].

## 2020-09-16 DIAGNOSIS — I10 Essential (primary) hypertension: Secondary | ICD-10-CM | POA: Diagnosis not present

## 2020-09-16 LAB — BASIC METABOLIC PANEL
Anion gap: 10 (ref 5–15)
BUN: 14 mg/dL (ref 8–23)
CO2: 25 mmol/L (ref 22–32)
Calcium: 9.1 mg/dL (ref 8.9–10.3)
Chloride: 107 mmol/L (ref 98–111)
Creatinine, Ser: 0.98 mg/dL (ref 0.44–1.00)
GFR, Estimated: >60 mL/min (ref >60)
Glucose, Bld: 99 mg/dL (ref 70–99)
Potassium: 3.7 mmol/L (ref 3.5–5.1)
Sodium: 142 mmol/L (ref 135–145)

## 2020-09-16 NOTE — Progress Notes (Signed)
Occupational Therapy Treatment Patient Details Name: [name] MRN: [id number] DOB: 15-Jan-1946 Today's Date: 09/16/2020    History of present illness Pt is a 75 y/o female admitted secondary to hypothermia and chest pain. PMH includes HTN.   OT comments  Pt making steady progress towards OT goals this session. Pt declined completing higher level IADL task of preparing hot chocolate but able to accurately state sequence of steps with no cues. Pt able to complete household distance functional mobility with single point cane with min guard assist. Pt also able to collect ADL items from closet with min guard with reaching out of BOS. Pt does present with some mild cognitive impairments, but feel this is likely pts baseline. Noted family wanting SNF. Feel that is appropriate if 24/7 can not be provided. Will continue to follow acutely per POC.    Follow Up Recommendations  Home health OT;Supervision/Assistance - 24 hour;SNF;Other (comment) (may require SNF if 24/7 not available; safety concern without 24/7)    Equipment Recommendations  None recommended by OT    Recommendations for Other Services      Precautions / Restrictions Precautions Precautions: Fall Restrictions Weight Bearing Restrictions: No       Mobility Bed Mobility               General bed mobility comments: pt OOB in recliner and returned to recliner at end of session  Transfers Overall transfer level: Needs assistance Equipment used: Straight cane Transfers: Sit to/from Stand Sit to Stand: Min guard         General transfer comment: minguard for safety    Balance Overall balance assessment: Needs assistance Sitting-balance support: No upper extremity supported;Feet supported Sitting balance-Leahy Scale: Good     Standing balance support: Single extremity supported;During functional activity Standing balance-Leahy Scale: Fair Standing balance comment: pt reaching out of BOS to collect ADL items  with oen UE supported on [facility]                           ADL either performed or assessed with clinical judgement   ADL Overall ADL's : At baseline;Needs assistance/impaired                 Upper Body Dressing : Supervision/safety;Sitting;Set up Upper Body Dressing Details (indicate cue type and reason): pt able to don jacket with set- up supevision assist from sitting in recliner     Toilet Transfer: Min guard;Ambulation Toilet Transfer Details (indicate cue type and reason): simulated via functional mobiltiy with min guard +1 with single point cane         Functional mobility during ADLs: Min guard;Cane General ADL Comments: Pt requires supervisionA for all ADL/iADL for safety. pt able to gather ADL items out of closet with minguard +1 for safety, pt declined completing IADL task of making hot chocolate but able to state correct sequence of all steps with no cues. pt with some mild cognitive impairments but feel this maybe baseline     Vision Baseline Vision/History: Wears glasses Wears Glasses: Reading only Patient Visual Report: No change from baseline     Perception     Praxis      Cognition Arousal/Alertness: Awake/alert Behavior During Therapy: WFL for tasks assessed/performed Overall Cognitive Status: Impaired/Different from baseline Area of Impairment: Orientation;Safety/judgement                 Orientation Level:  (pt alert and oriented but then stating that she  plans to leave tomorrow to run to her house to get some items before going to rehab. pt also stating she needs to go upstairs and get her shoes)   Memory: Decreased short-term memory   Safety/Judgement: Decreased awareness of safety (pt impulsively reaching out of BOS for dropped items)     General Comments: pt declined completing IADL task of making hot chocolate but able to state correct sequence of steps, pt alert and oriented but makes statements such as I am going to go home  tomorrow to get some items before I go to rehab, pt also thinking she left her shoes upstairs?        Exercises     Shoulder Instructions       General Comments      Pertinent Vitals/ Pain       Pain Assessment: No/denies pain  Home Living                                          Prior Functioning/Environment              Frequency  Min 2X/week        Progress Toward Goals  OT Goals(current goals can now be found in the care plan section)  Progress towards OT goals: Progressing toward goals  Acute Rehab OT Goals Time For Goal Achievement: 09/28/20 Potential to Achieve Goals: Good  Plan Discharge plan remains appropriate;Frequency remains appropriate    Co-evaluation                 AM-PAC OT "6 Clicks" Daily Activity     Outcome Measure   Help from another person eating meals?: None Help from another person taking care of personal grooming?: A Little Help from another person toileting, which includes using toliet, bedpan, or urinal?: A Little Help from another person bathing (including washing, rinsing, drying)?: A Little Help from another person to put on and taking off regular upper body clothing?: None Help from another person to put on and taking off regular lower body clothing?: A Little 6 Click Score: 20    End of Session Equipment Utilized During Treatment: Other (comment) (SPC)  OT Visit Diagnosis: Unsteadiness on feet (R26.81);Muscle weakness (generalized) (M62.81);Other symptoms and signs involving cognitive function   Activity Tolerance Patient tolerated treatment well   Patient Left in chair;with call bell/phone within reach;with chair alarm set   Nurse Communication Mobility status        Time: 1451-1506 OT Time Calculation (min): 15 min  Charges: OT General Charges $OT Visit: 1 Visit OT Treatments $Self Care/Home Management : 8-22 mins  Harley Alto., COTA/L Acute Rehabilitation  Services [phone]    Precious Haws 09/16/2020, 3:16 PM

## 2020-09-16 NOTE — Progress Notes (Signed)
Physical Therapy Treatment Patient Details Name: [name] MRN: [id number] DOB: 08-06-1946 Today's Date: 09/16/2020    History of Present Illness Pt is a 75 y/o female admitted secondary to hypothermia and chest pain. PMH includes HTN.    PT Comments    Patient received in bed, very pleasant and with significantly improved cognition today- much less impulsive, able to recall events of PT yesterday, and able to spell world backwards and count backwards to 65 from 100 by serial 7s. Able to mobilize mostly on a min guard basis with SPC but did have one instance where she needed MinA due to mild LOB secondary to fatigue.  Reports she is still very much against going to rehab, but would be comfortable getting help from or even living with her cousins as they are less than 10 minutes up the road from her- encouraged her to pursue this option and contact cousins to confirm their ability to help. Left up in recliner with all needs met, chair alarm active.     Follow Up Recommendations  Home health PT;Supervision for mobility/OOB (cousins live near by and she can stay with them or have them stay with her)     Equipment Recommendations  None recommended by PT    Recommendations for Other Services       Precautions / Restrictions Precautions Precautions: Fall Restrictions Weight Bearing Restrictions: No    Mobility  Bed Mobility Overal bed mobility: Needs Assistance Bed Mobility: Supine to Sit     Supine to sit: Supervision;HOB elevated     General bed mobility comments: increased time and effort, use of railing  Transfers Overall transfer level: Needs assistance Equipment used: Straight cane Transfers: Sit to/from Stand Sit to Stand: Min guard         General transfer comment: min guard and increased time/effort, balance better today  Ambulation/Gait Ambulation/Gait assistance: Min guard Gait Distance (Feet): 170 Feet Assistive device: Straight cane Gait  Pattern/deviations: Step-through pattern;Decreased stride length;Shuffle;Narrow base of support;Trunk flexed Gait velocity: Decreased   General Gait Details: gait and balance much better today, only needed Min guard for majority of gait distance but did have one mild LOB to the right as fatigue increased requiring MinA to recover   Stairs Stairs: Yes Stairs assistance: Min guard Stair Management: One rail Right;Step to pattern;Forwards;With cane Number of Stairs: 5 General stair comments: slow and steady on steps with SPC and step to pattern but no significant balance loss noted   Wheelchair Mobility    Modified [place] (Stroke Patients Only)       Balance Overall balance assessment: Needs assistance Sitting-balance support: No upper extremity supported;Feet supported Sitting balance-Leahy Scale: Good     Standing balance support: Single extremity supported;During functional activity Standing balance-Leahy Scale: Fair Standing balance comment: min G to MinA with SPC                            Cognition Arousal/Alertness: Awake/alert Behavior During Therapy: WFL for tasks assessed/performed Overall Cognitive Status: Within Functional Limits for tasks assessed                                 General Comments: much clearer cognitively today- not impulsive, and able to spell world backwards and count backwards to 65 by 7s, recalled events of yesterday with PT      Exercises  General Comments        Pertinent Vitals/Pain Pain Assessment: No/denies pain    Home Living                      Prior Function            PT Goals (current goals can now be found in the care plan section) Acute Rehab PT Goals Patient Stated Goal: to go home PT Goal Formulation: With patient Time For Goal Achievement: 09/28/20 Potential to Achieve Goals: Good Progress towards PT goals: Progressing toward goals    Frequency    Min  3X/week      PT Plan Discharge plan needs to be updated    Co-evaluation              AM-PAC PT "6 Clicks" Mobility   Outcome Measure  Help needed turning from your back to your side while in a flat bed without using bedrails?: A Little Help needed moving from lying on your back to sitting on the side of a flat bed without using bedrails?: A Little Help needed moving to and from a bed to a chair (including a wheelchair)?: A Little Help needed standing up from a chair using your arms (e.g., wheelchair or bedside chair)?: A Little Help needed to walk in hospital room?: A Little Help needed climbing 3-5 steps with a railing? : A Little 6 Click Score: 18    End of Session Equipment Utilized During Treatment: Gait belt Activity Tolerance: Patient tolerated treatment well Patient left: in chair;with call bell/phone within reach;with chair alarm set Nurse Communication: Mobility status PT Visit Diagnosis: Unsteadiness on feet (R26.81);Muscle weakness (generalized) (M62.81)     Time: 6950-7225 PT Time Calculation (min) (ACUTE ONLY): 24 min  Charges:  $Gait Training: 8-22 mins $Therapeutic Activity: 8-22 mins                     Windell Norfolk, DPT, PN1   Supplemental Physical Therapist [place]    Pager [phone] Acute Rehab Office [phone]

## 2020-09-16 NOTE — Progress Notes (Signed)
PROGRESS NOTE    [name]  XVQ:[id number] DOB: [birth year] DOA: 09/13/2020 PCP: Patient, No Pcp Per   Brief Narrative:  HPI: [name] is a 75 y.o. female past medical history of essential hypertension the hospital for atypical chest pain she really cannot describe, she relates she was in bed when she had it nothing makes it better or worse she denies any associated symptoms.  She relates no new medications or over-the-counter medications.  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  She has not been started on any oral hypoglycemic agents no recent trauma. In the ED: She was moderately hypothermic degrees Celsius mildly bradycardic 40s to 50s and mildly hypotensive saturations  than 100% on room air, her sodium was 138 he ate glucose was 139, she relates no sores or cuts her white count is 4.2, TSH is 4.5 and chest x-ray shows no acute abnormalities   Assessment & Plan:   Active Problems:   Hypothermia   Sinus bradycardia   Essential hypertension  Hypothermia: Patient presented with an episode of hypothermia. No source identified.  Patient is back to normal.  Chest pain: Patient came in with atypical chest pain.  Cardiac enzymes negative.  No more chest pain currently.  Sinus bradycardia: Resolved.  Essential hypertension: Blood pressure within normal range.  Continue to hold antihypertensives.  Possible early signs of dementia: Patient has some cognitive issues.  However today she is fully alert and oriented.  She has signs of early dementia which can be observed intermittently.  Hypokalemia: Resolved  Generalized weakness: Patient was seen by PT OT who recommended discharging home with 24-hour supervision or discharging to SNF.  Patient son is not willing to provide 24-hour care.  Patient was hoping that her cousin will either live with her or she will live with a cousin however she faced disappointment  there as well today and finally she has agreed to go to SNF.  TOC has been found and is working on finding placement.  Patient is not safe to go home since she does not have 24-hour supervision.  DVT prophylaxis: heparin injection 5,000 Units Start: 09/13/20 2200   Code Status: Full Code  Family Communication: None present at bedside.  Status is: Observation  The patient will require care spanning > 2 midnights and should be moved to inpatient because: Unsafe d/c plan  Dispo: The patient is from: Home              Anticipated d/c is to: Home              Anticipated d/c date is: 1 day              Patient currently is medically stable to d/c.   Difficult to place patient yes        Estimated body mass index is 34.38 kg/m as calculated from the following:   Height as of this encounter: 5\' 6"  (1.676 m).   Weight as of this encounter: 96.6 kg.      Nutritional status:               Consultants:   None  Procedures:   None  Antimicrobials:  Anti-infectives (From admission, onward)   Start     Dose/Rate Route Frequency Ordered Stop   09/13/20 1315  cefTRIAXone (ROCEPHIN) 1 g in sodium chloride 0.9 % 100 mL IVPB  Status:  Discontinued  1 g 200 mL/hr over 30 Minutes Intravenous  Once 09/13/20 1304 09/13/20 1342         Subjective: Patient seen and examined.  She has no complaints.  I was informed by nursing staff that patient son has been calling and has been very rude and inappropriate and abusive to the staff at multiple times.  Objective: Vitals:   09/15/20 1631 09/15/20 2025 09/16/20 0507 09/16/20 0911  BP: 125/72 120/66 132/66 105/61  Pulse: 71 64 70 68  Resp: 18 20 20 14   Temp: 97.6 F (36.4 C) (!) 97.5 F (36.4 C) 97.7 F (36.5 C) 97.8 F (36.6 C)  TempSrc:  Oral    SpO2: 95% 97% 94% 94%  Weight:      Height:        Intake/Output Summary (Last 24 hours) at 09/16/2020 1311 Last data filed at 09/16/2020 1108 Gross per 24 hour   Intake 650 ml  Output 1175 ml  Net -525 ml   Filed Weights   09/13/20 0728  Weight: 96.6 kg    Examination:  General exam: Appears calm and comfortable  Respiratory system: Clear to auscultation. Respiratory effort normal. Cardiovascular system: S1 & S2 heard, RRR. No JVD, murmurs, rubs, gallops or clicks. No pedal edema. Gastrointestinal system: Abdomen is nondistended, soft and nontender. No organomegaly or masses felt. Normal bowel sounds heard. Central nervous system: Alert and oriented. No focal neurological deficits. Extremities: Symmetric 5 x 5 power. Skin: No rashes, lesions or ulcers.  Psychiatry: Judgement and insight appear poor   Data Reviewed: I have personally reviewed following labs and imaging studies  CBC: Recent Labs  Lab 09/13/20 0730 09/13/20 2112 09/14/20 0621 09/15/20 0907  WBC 4.2 4.8 6.5 6.9  NEUTROABS  --   --   --  3.5  HGB 14.0 12.3 11.5* 11.4*  HCT 44.4 37.5 34.4* 34.9*  MCV 84.4 81.2 81.7 81.4  PLT 231 224 167 938   Basic Metabolic Panel: Recent Labs  Lab 09/13/20 0730 09/13/20 2112 09/14/20 0621 09/15/20 0907 09/16/20 1002  NA 138  --  139 140 142  K 3.6  --  3.6 3.3* 3.7  CL 101  --  107 105 107  CO2 21*  --  20* 25 25  GLUCOSE 139*  --  64* 71 99  BUN 25*  --  22 19 14   CREATININE 0.87 0.90 0.82 0.88 0.98  CALCIUM 9.7  --  8.2* 8.9 9.1  MG  --   --   --  1.7  --    GFR: Estimated Creatinine Clearance: 60.8 mL/min (by C-G formula based on SCr of 0.98 mg/dL). Liver Function Tests: Recent Labs  Lab 09/14/20 0621  AST 31  ALT 19  ALKPHOS 86  BILITOT 0.9  PROT 5.6*  ALBUMIN 2.7*   Recent Labs  Lab 09/13/20 1417  LIPASE 19   Recent Labs  Lab 09/14/20 0643  AMMONIA 31   Coagulation Profile: No results for input(s): INR, PROTIME in the last 168 hours. Cardiac Enzymes: Recent Labs  Lab 09/13/20 1417  CKTOTAL 102   BNP (last 3 results) No results for input(s): PROBNP in the last 8760 hours. HbA1C: No  results for input(s): HGBA1C in the last 72 hours. CBG: No results for input(s): GLUCAP in the last 168 hours. Lipid Profile: No results for input(s): CHOL, HDL, LDLCALC, TRIG, CHOLHDL, LDLDIRECT in the last 72 hours. Thyroid Function Tests: No results for input(s): TSH, T4TOTAL, FREET4, T3FREE, THYROIDAB in the last 72  hours. Anemia Panel: No results for input(s): VITAMINB12, FOLATE, FERRITIN, TIBC, IRON, RETICCTPCT in the last 72 hours. Sepsis Labs: Recent Labs  Lab 09/13/20 8299  LATICACIDVEN 1.9    Recent Results (from the past 240 hour(s))  SARS Coronavirus 2 by RT PCR (hospital order, performed in [facility] hospital lab) Nasopharyngeal Nasopharyngeal Swab     Status: None   Collection Time: 09/13/20  1:12 PM   Specimen: Nasopharyngeal Swab  Result Value Ref Range Status   SARS Coronavirus 2 NEGATIVE NEGATIVE Final    Comment: (NOTE) SARS-CoV-2 target nucleic acids are NOT DETECTED.  The SARS-CoV-2 RNA is generally detectable in upper and lower respiratory specimens during the acute phase of infection. The lowest concentration of SARS-CoV-2 viral copies this assay can detect is 250 copies / mL. A negative result does not preclude SARS-CoV-2 infection and should not be used as the sole basis for treatment or other patient management decisions.  A negative result may occur with improper specimen collection / handling, submission of specimen other than nasopharyngeal swab, presence of viral mutation(s) within the areas targeted by this assay, and inadequate number of viral copies (<250 copies / mL). A negative result must be combined with clinical observations, patient history, and epidemiological information.  Fact Sheet for Patients:   StrictlyIdeas.no  Fact Sheet for Healthcare Providers: BankingDealers.co.za  This test is not yet approved or  cleared by the [place] FDA and has been authorized for detection and/or  diagnosis of SARS-CoV-2 by FDA under an Emergency Use Authorization (EUA).  This EUA will remain in effect (meaning this test can be used) for the duration of the COVID-19 declaration under Section 564(b)(1) of the Act, 21 U.S.C. section 360bbb-3(b)(1), unless the authorization is terminated or revoked sooner.  Performed at [facility], [place] [address] [place], [place] [address]   Blood culture (routine x 2)     Status: None (Preliminary result)   Collection Time: 09/13/20  1:34 PM   Specimen: BLOOD LEFT ARM  Result Value Ref Range Status   Specimen Description BLOOD LEFT ARM  Final   Special Requests   Final    BOTTLES DRAWN AEROBIC ONLY Blood Culture adequate volume   Culture   Final    NO GROWTH 3 DAYS Performed at [facility], [place] [address], [place], [place] [address]    Report Status PENDING  Incomplete  Blood culture (routine x 2)     Status: None (Preliminary result)   Collection Time: 09/13/20  1:35 PM   Specimen: BLOOD LEFT HAND  Result Value Ref Range Status   Specimen Description BLOOD LEFT HAND  Final   Special Requests   Final    BOTTLES DRAWN AEROBIC AND ANAEROBIC Blood Culture results may not be optimal due to an inadequate volume of blood received in culture bottles   Culture   Final    NO GROWTH 3 DAYS Performed at [facility], [place] [address], [place], [place] [address]    Report Status PENDING  Incomplete      Radiology Studies: No results found.  Scheduled Meds: . heparin  5,000 Units Subcutaneous Q8H   Continuous Infusions:    LOS: 2 days   Time spent: 27 minutes   Darliss Cheney, MD [facility]  09/16/2020, 1:11 PM   To contact the attending provider between 7A-7P or the covering provider during after hours 7P-7A, please log into the web site [email or web address].

## 2020-09-16 NOTE — TOC Initial Note (Addendum)
Transition of Care [facility]) - Initial/Assessment Note    Patient Details  Name: [name] MRN: [id number] Date of Birth: May 12, 1946  Transition of [facility]) CM/SW Contact:    Bartholomew Crews, RN Phone Number:  [phone] 09/16/2020, 5:11 PM  Clinical Narrative:                  Spoke with patient and her son, [name], at the bedside. Verbal consent provided to include [name] in the conversation for transition planning. Patient home alone - she lives at [address], [place], [place] [address]. She stated the address listed in Epic is where [place] lives. Phone number verified. PCP is Dr. Ardeth Perfect.   Discussed that she walked with therapy 170'. Per Medicare guidelines, she should transition home with home health rather than SNF. Patient and son are in agreement. Son stated that he can check in on her and will make sure that she has everything that she needs.   Discussed home health needs. Referral accepted by [facility] for nursing, PT, OT. PT will start care next week and nursing will follow up with patient the end of the week.  Patient will need [place] orders for RN, PT, OT with Face to Face.   TOC following for transition needs.   Expected Discharge Plan: [place] Barriers to Discharge: Continued Medical Work up   Patient Goals and CMS Choice Patient states their goals for this hospitalization and ongoing recovery are:: return home with family assist CMS Medicare.gov Compare Post Acute Care list provided to:: Patient Choice offered to / list presented to : Patient  Expected Discharge Plan and Services Expected Discharge Plan: [place] In-house Referral: Clinical Social Work Discharge Planning Services: CM Consult Post Acute Care Choice: [place] arrangements for the past 2 months: Single Family Home                 DME Arranged: N/A DME Agency: NA       HH Arranged: RN,PT,OT          Prior Living Arrangements/Services Living  arrangements for the past 2 months: Single Family Home Lives with:: Self Patient language and need for interpreter reviewed:: Yes Do you feel safe going back to the place where you live?: Yes      Need for Family Participation in Patient Care: Yes (Comment) Care giver support system in place?: Yes (comment) Current home services: DME Criminal Activity/Legal Involvement Pertinent to Current Situation/Hospitalization: No - Comment as needed  Activities of Daily Living Home Assistive Devices/Equipment: Cane (specify quad or straight) ADL Screening (condition at time of admission) Patient's cognitive ability adequate to safely complete daily activities?: Yes Is the patient deaf or have difficulty hearing?: No Does the patient have difficulty seeing, even when wearing glasses/contacts?: No Does the patient have difficulty concentrating, remembering, or making decisions?: No Patient able to express need for assistance with ADLs?: Yes Does the patient have difficulty dressing or bathing?: No Independently performs ADLs?: Yes (appropriate for developmental age) Does the patient have difficulty walking or climbing stairs?: Yes Weakness of Legs: Both Weakness of Arms/Hands: None  Permission Sought/Granted Permission sought to share information with : Family Supports    Share Information with NAME: [name]     Permission granted to share info w Relationship: son  Permission granted to share info w Contact Information: [phone]  Emotional Assessment Appearance:: Appears stated age Attitude/Demeanor/Rapport: Engaged Affect (typically observed): Accepting Orientation: : Oriented to Self,Oriented  to  Time,Oriented to Place,Oriented to Situation Alcohol / Substance Use: Not Applicable Psych Involvement: No (comment)  Admission diagnosis:  Hypothermia [T68.XXXA] Nonspecific chest pain [R07.9] Hypothermia, initial encounter [T68.XXXA] Patient Active Problem List   Diagnosis Date  Noted  . Hypothermia 09/13/2020  . Sinus bradycardia 09/13/2020  . Essential hypertension 09/13/2020   PCP:  Velna Hatchet, MD Pharmacy:   [id number] - [place], [place] [address] Phone: [phone] Fax: [phone]     Social Determinants of Health (SDOH) Interventions    Readmission Risk Interventions No flowsheet data found.

## 2020-09-16 NOTE — Progress Notes (Signed)
Patient's son Glendell Docker came up to the room earlier @ 20:30,talked with him regarding plan of care and medication to patient, he was very cordial.then he left, after an hour he called the unit phone, I was not available, NS freda transfer the call to [place], patient son was rude at the start for unknown reason, cussing the charge RN and NS. Telling them not to give her mom medication that will alter her mentation. The line went dead, he called back , this RN answered him, accusing that we cut the line, and very upset, hollering at [place] and wont listen to reason. I let him talked, voiced all his frustrations and accusations, I then calmly explain what is happening and plan of care again, remind him that we talked earlier, he was mad at the nurse who he talked first for being rude and disrespectful. He eventually apologized and hung up.

## 2020-09-17 DIAGNOSIS — T68XXXD Hypothermia, subsequent encounter: Secondary | ICD-10-CM

## 2020-09-17 MED ORDER — HALOPERIDOL LACTATE 5 MG/ML IJ SOLN
INTRAMUSCULAR | Status: AC
  Administered 2020-09-17: 5 mg
  Filled 2020-09-17: qty 1

## 2020-09-17 MED ORDER — HALOPERIDOL LACTATE 5 MG/ML IJ SOLN
2.0000 mg | Freq: Once | INTRAMUSCULAR | Status: AC | PRN
  Administered 2020-09-18: 2 mg via INTRAMUSCULAR
  Filled 2020-09-17: qty 1

## 2020-09-17 NOTE — Progress Notes (Addendum)
Physical Therapy Treatment Patient Details Name: [name] MRN: [id number] DOB: 04/26/1946 Today's Date: 09/17/2020    History of Present Illness Pt is a 75 y/o female admitted secondary to hypothermia and chest pain. PMH includes HTN.    PT Comments    Patient A&Ox4 this session. Patient overall supervision for bed mobility and min guard for ambulation with SPC. Patient tends to reach for objects while ambulating for support. Patient continues to be limited by impaired balance and generalized weakness. Continue to recommend HHPT following discharge to maximize functional mobility and safety.     Follow Up Recommendations  Home health PT;Supervision for mobility/OOB;SNF (If family unable to provide supervision/assistance in the home, recommend SNF)     Equipment Recommendations  None recommended by PT    Recommendations for Other Services       Precautions / Restrictions Precautions Precautions: Fall Restrictions Weight Bearing Restrictions: No    Mobility  Bed Mobility Overal bed mobility: Needs Assistance Bed Mobility: Supine to Sit;Sit to Supine     Supine to sit: Supervision Sit to supine: Supervision   General bed mobility comments: supervision for safety  Transfers Overall transfer level: Needs assistance Equipment used: Straight cane Transfers: Sit to/from Stand Sit to Stand: Min guard         General transfer comment: minguard for safety  Ambulation/Gait Ambulation/Gait assistance: Min guard Gait Distance (Feet): 200 Feet Assistive device: Straight cane Gait Pattern/deviations: Step-through pattern;Decreased stride length;Narrow base of support;Trunk flexed Gait velocity: Decreased   General Gait Details: gait quality decreases with fatigue, however overall min guard for safety   Stairs             Wheelchair Mobility    Modified Renz (Stroke Patients Only)       Balance Overall balance assessment: Needs  assistance Sitting-balance support: No upper extremity supported;Feet supported Sitting balance-Leahy Scale: Good     Standing balance support: Single extremity supported;During functional activity Standing balance-Leahy Scale: Fair Standing balance comment: reliant on UE support and external support as patient reaches out for objects during ambulation for support                            Cognition Arousal/Alertness: Awake/alert Behavior During Therapy: WFL for tasks assessed/performed Overall Cognitive Status: Within Functional Limits for tasks assessed                                 General Comments: A&Ox4      Exercises General Exercises - Lower Extremity Long Arc Quad: Both;10 reps Hip ABduction/ADduction: Both;5 reps Hip Flexion/Marching: Both;10 reps    General Comments        Pertinent Vitals/Pain Pain Assessment: No/denies pain    Home Living                      Prior Function            PT Goals (current goals can now be found in the care plan section) Acute Rehab PT Goals Patient Stated Goal: to go home PT Goal Formulation: With patient Time For Goal Achievement: 09/28/20 Potential to Achieve Goals: Good Progress towards PT goals: Progressing toward goals    Frequency    Min 3X/week      PT Plan Current plan remains appropriate    Co-evaluation  AM-PAC PT "6 Clicks" Mobility   Outcome Measure  Help needed turning from your back to your side while in a flat bed without using bedrails?: A Little Help needed moving from lying on your back to sitting on the side of a flat bed without using bedrails?: A Little Help needed moving to and from a bed to a chair (including a wheelchair)?: A Little Help needed standing up from a chair using your arms (e.g., wheelchair or bedside chair)?: A Little Help needed to walk in hospital room?: A Little Help needed climbing 3-5 steps with a railing? : A  Little 6 Click Score: 18    End of Session Equipment Utilized During Treatment: Gait belt Activity Tolerance: Patient tolerated treatment well Patient left: in bed;with call bell/phone within reach;with bed alarm set Nurse Communication: Mobility status PT Visit Diagnosis: Unsteadiness on feet (R26.81);Muscle weakness (generalized) (M62.81)     Time: 1601-0932 PT Time Calculation (min) (ACUTE ONLY): 17 min  Charges:  $Therapeutic Activity: 8-22 mins                     Kristina Mcnorton A. Gilford Rile PT, DPT Acute Rehabilitation Services Pager [phone] Office [phone]    Alda Lea 09/17/2020, 4:46 PM

## 2020-09-17 NOTE — NC FL2 (Signed)
  [place] MEDICAID FL2 LEVEL OF CARE SCREENING TOOL     IDENTIFICATION  Patient Name: [name] Birthdate: Oct 31, 1945 Sex: female Admission Date (Current Location): 09/13/2020  [facility] and [place] Number:  Herbalist and Address:  [facility], [place] [address] [place], [place] [address]      Provider Number: [id number]  Attending Physician Name and Address:  Darliss Cheney, MD  Relative Name and Phone Number:  Kataleyah Carducci, [phone]    Current Level of Care: Hospital Recommended Level of Care: [place] Prior Approval Number:    Date Approved/Denied:   PASRR Number: [id number]  Discharge Plan: SNF    Current Diagnoses: Patient Active Problem List   Diagnosis Date Noted  . Hypothermia 09/13/2020  . Sinus bradycardia 09/13/2020  . Essential hypertension 09/13/2020    Orientation RESPIRATION BLADDER Height & Weight     Self,Place  Normal Incontinent Weight: 213 lb (96.6 kg) Height:  5\' 6"  (167.6 cm)  BEHAVIORAL SYMPTOMS/MOOD NEUROLOGICAL BOWEL NUTRITION STATUS      Incontinent Diet (See DC Summary)  AMBULATORY STATUS COMMUNICATION OF NEEDS Skin   Limited Assist Verbally Skin abrasions (MASD Perenium)                       Personal Care Assistance Level of Assistance  Bathing,Feeding,Dressing Bathing Assistance: Limited assistance Feeding assistance: Independent Dressing Assistance: Limited assistance     Functional Limitations Info  Sight,Hearing,Speech Sight Info: Adequate Hearing Info: Adequate Speech Info: Adequate    SPECIAL CARE FACTORS FREQUENCY  PT (By licensed PT),OT (By licensed OT)     PT Frequency: 5x week OT Frequency: 5x week            Contractures Contractures Info: Not present    Additional Factors Info  Code Status,Allergies Code Status Info: Full Allergies Info: Shellfish           Current Medications (09/17/2020):  This is the current hospital active medication  list Current Facility-Administered Medications  Medication Dose Route Frequency Provider Last Rate Last Admin  . acetaminophen (TYLENOL) tablet 650 mg  650 mg Oral Q6H PRN Charlynne Cousins, MD       Or  . acetaminophen (TYLENOL) suppository 650 mg  650 mg Rectal Q6H PRN Charlynne Cousins, MD      . haloperidol lactate (HALDOL) injection 2 mg  2 mg Intramuscular Once PRN Shela Leff, MD      . heparin injection 5,000 Units  5,000 Units Subcutaneous Q8H Charlynne Cousins, MD   5,000 Units at 09/17/20 1326  . ondansetron (ZOFRAN) tablet 4 mg  4 mg Oral Q6H PRN Charlynne Cousins, MD       Or  . ondansetron [facility]) injection 4 mg  4 mg Intravenous Q6H PRN Charlynne Cousins, MD      . polyethylene glycol [facility]) packet 17 g  17 g Oral Daily PRN Charlynne Cousins, MD         Discharge Medications: Please see discharge summary for a list of discharge medications.  Relevant Imaging Results:  Relevant Lab Results:   Additional Information SS# [place]  Coralee Pesa, LCSWA

## 2020-09-17 NOTE — Progress Notes (Signed)
   09/17/20 0300  Pain Assessment  Pain Scale 0-10  Pain Score 0  Provider Notification  Provider Name/Title Dr. Marlowe Sax  Date Provider Notified 09/17/20  Time Provider Notified [phone]  Notification Type Page  Notification Reason Change in status  Response See new orders   During the night, the patient refused to get back to bed and wanted to stay in the chair.  She had her coat on and refused to take off.  She refused her heparin injection.  She is alert to person, place, month and year.  At approximately 0230, patient became more confused and agitated.  She insisted that she was not at the hospital and she needed to go check on her mother and father who were outside the door.  Attempts at reorientation was unsuccessful.  She stated her car was outside and that she knew where her keys were.  She stated she had to go to work.  She walked into the hallway and attempted to go into another patient's room.  It took 2 nurses to safely get her out of that patient's room and back to her room.  She had had a bowel movement and it took 3 nurses to clean her because she wanted to leave and go home.  Dr. Marlowe Sax called and made aware.  Order received for 2 mg IV Haldol which was given at 0311.  Patient assisted to lay back in the bed.  Will continue to monitor patient for safety.  Earleen Reaper RN

## 2020-09-17 NOTE — Progress Notes (Signed)
PROGRESS NOTE    [name]  [id number] DOB: 08-09-1946 DOA: 09/13/2020 PCP: Velna Hatchet, MD   Brief Narrative:  [name] is a 75 y.o. female past medical history of essential hypertension presented to hospital for atypical chest pain.  Upon presentation to ED, she was moderately hypothermic with bradycardia and mild hypotension.  Her heating system is working at home, she has not slept outside, she has not consumed any alcohol. She was also confused upon presentation.  She was admitted for further management.  She has improved however still has some intermittent cognitive issues/confusion at times.  Evaluated by PT OT who recommends home health PT with 24-hour supervision or SNF.  Has a son who lives in town but he is not willing to provide 24-hour care.  Working on finding a placement.   Assessment & Plan:   Active Problems:   Hypothermia   Sinus bradycardia   Essential hypertension  Hypothermia: Patient presented with an episode of hypothermia. No source identified.  Patient is back to normal.  Chest pain: Patient came in with atypical chest pain.  Cardiac enzymes negative.  No more chest pain currently.  Sinus bradycardia: Resolved.  Essential hypertension: Blood pressure within normal range.  Continue to hold antihypertensives.  Possible early signs of dementia: Patient has some cognitive issues.  However today she is fully alert and oriented.  She has signs of early dementia which can be observed intermittently.  Hypokalemia: Resolved  Generalized weakness/poor cognition: Patient was seen by PT OT who recommended discharging home with 24-hour supervision or discharging to SNF.  Patient son is not willing to provide 24-hour care.  Patient was hoping that her cousin will either live with her or she will live with a cousin however she faced disappointment there as well today and finally she has agreed to go to SNF.  Patient is improving as far as her physical  strength was but still requires home health with 24-hour care.  Patient once again last night was significantly confused, was trying to go home and eventually went to other patients room.  Required significant amount of time and personnel to reorient her.  She was then given Haldol.  She was sleepy this morning.  Patient is not safe to go home since she does not have 24-hour supervision.  DVT prophylaxis: heparin injection 5,000 Units Start: 09/13/20 2200   Code Status: Full Code  Family Communication: None present at bedside.  Status is: Observation  The patient will require care spanning > 2 midnights and should be moved to inpatient because: Unsafe d/c plan  Dispo: The patient is from: Home              Anticipated d/c is to: Home              Anticipated d/c date is: 1 day              Patient currently is medically stable to d/c.   Difficult to place patient yes        Estimated body mass index is 34.38 kg/m as calculated from the following:   Height as of this encounter: 5\' 6"  (1.676 m).   Weight as of this encounter: 96.6 kg.      Nutritional status:               Consultants:   None  Procedures:   None  Antimicrobials:  Anti-infectives (From admission, onward)   Start     Dose/Rate Route  Frequency Ordered Stop   09/13/20 1315  cefTRIAXone (ROCEPHIN) 1 g in sodium chloride 0.9 % 100 mL IVPB  Status:  Discontinued        1 g 200 mL/hr over 30 Minutes Intravenous  Once 09/13/20 1304 09/13/20 1342         Subjective: Patient seen and examined.  She was sleepy this morning but comfortable.  No complaints.  Objective: Vitals:   09/16/20 0507 09/16/20 0911 09/16/20 1758 09/16/20 2009  BP: 132/66 105/61 115/66 112/67  Pulse: 70 68 69 61  Resp: 20 14 16 16   Temp: 97.7 F (36.5 C) 97.8 F (36.6 C) (!) 97.5 F (36.4 C) 97.6 F (36.4 C)  TempSrc:    Oral  SpO2: 94% 94% 98% 94%  Weight:      Height:        Intake/Output Summary (Last 24  hours) at 09/17/2020 1102 Last data filed at 09/17/2020 0600 Gross per 24 hour  Intake 360 ml  Output 250 ml  Net 110 ml   Filed Weights   09/13/20 0728  Weight: 96.6 kg    Examination:  General exam: Appears calm and comfortable  Respiratory system: Clear to auscultation. Respiratory effort normal. Cardiovascular system: S1 & S2 heard, RRR. No JVD, murmurs, rubs, gallops or clicks. No pedal edema. Gastrointestinal system: Abdomen is nondistended, soft and nontender. No organomegaly or masses felt. Normal bowel sounds heard. Central nervous system: Sleepy and partially oriented.  No focal neurological deficits. Extremities: Symmetric 5 x 5 power. Skin: No rashes, lesions or ulcers.  Psychiatry: Judgement and insight appear poor   Data Reviewed: I have personally reviewed following labs and imaging studies  CBC: Recent Labs  Lab 09/13/20 0730 09/13/20 2112 09/14/20 0621 09/15/20 0907  WBC 4.2 4.8 6.5 6.9  NEUTROABS  --   --   --  3.5  HGB 14.0 12.3 11.5* 11.4*  HCT 44.4 37.5 34.4* 34.9*  MCV 84.4 81.2 81.7 81.4  PLT 231 224 167 782   Basic Metabolic Panel: Recent Labs  Lab 09/13/20 0730 09/13/20 2112 09/14/20 0621 09/15/20 0907 09/16/20 1002  NA 138  --  139 140 142  K 3.6  --  3.6 3.3* 3.7  CL 101  --  107 105 107  CO2 21*  --  20* 25 25  GLUCOSE 139*  --  64* 71 99  BUN 25*  --  22 19 14   CREATININE 0.87 0.90 0.82 0.88 0.98  CALCIUM 9.7  --  8.2* 8.9 9.1  MG  --   --   --  1.7  --    GFR: Estimated Creatinine Clearance: 60.8 mL/min (by C-G formula based on SCr of 0.98 mg/dL). Liver Function Tests: Recent Labs  Lab 09/14/20 0621  AST 31  ALT 19  ALKPHOS 86  BILITOT 0.9  PROT 5.6*  ALBUMIN 2.7*   Recent Labs  Lab 09/13/20 1417  LIPASE 19   Recent Labs  Lab 09/14/20 0643  AMMONIA 31   Coagulation Profile: No results for input(s): INR, PROTIME in the last 168 hours. Cardiac Enzymes: Recent Labs  Lab 09/13/20 1417  CKTOTAL 102   BNP  (last 3 results) No results for input(s): PROBNP in the last 8760 hours. HbA1C: No results for input(s): HGBA1C in the last 72 hours. CBG: No results for input(s): GLUCAP in the last 168 hours. Lipid Profile: No results for input(s): CHOL, HDL, LDLCALC, TRIG, CHOLHDL, LDLDIRECT in the last 72 hours. Thyroid Function Tests: No results  for input(s): TSH, T4TOTAL, FREET4, T3FREE, THYROIDAB in the last 72 hours. Anemia Panel: No results for input(s): VITAMINB12, FOLATE, FERRITIN, TIBC, IRON, RETICCTPCT in the last 72 hours. Sepsis Labs: Recent Labs  Lab 09/13/20 7846  LATICACIDVEN 1.9    Recent Results (from the past 240 hour(s))  SARS Coronavirus 2 by RT PCR (hospital order, performed in [facility] hospital lab) Nasopharyngeal Nasopharyngeal Swab     Status: None   Collection Time: 09/13/20  1:12 PM   Specimen: Nasopharyngeal Swab  Result Value Ref Range Status   SARS Coronavirus 2 NEGATIVE NEGATIVE Final    Comment: (NOTE) SARS-CoV-2 target nucleic acids are NOT DETECTED.  The SARS-CoV-2 RNA is generally detectable in upper and lower respiratory specimens during the acute phase of infection. The lowest concentration of SARS-CoV-2 viral copies this assay can detect is 250 copies / mL. A negative result does not preclude SARS-CoV-2 infection and should not be used as the sole basis for treatment or other patient management decisions.  A negative result may occur with improper specimen collection / handling, submission of specimen other than nasopharyngeal swab, presence of viral mutation(s) within the areas targeted by this assay, and inadequate number of viral copies (<250 copies / mL). A negative result must be combined with clinical observations, patient history, and epidemiological information.  Fact Sheet for Patients:   StrictlyIdeas.no  Fact Sheet for Healthcare Providers: BankingDealers.co.za  This test is not yet  approved or  cleared by the [place] FDA and has been authorized for detection and/or diagnosis of SARS-CoV-2 by FDA under an Emergency Use Authorization (EUA).  This EUA will remain in effect (meaning this test can be used) for the duration of the COVID-19 declaration under Section 564(b)(1) of the Act, 21 U.S.C. section 360bbb-3(b)(1), unless the authorization is terminated or revoked sooner.  Performed at [facility], [place] [address] [place], [place] [address]   Blood culture (routine x 2)     Status: None (Preliminary result)   Collection Time: 09/13/20  1:34 PM   Specimen: BLOOD LEFT ARM  Result Value Ref Range Status   Specimen Description BLOOD LEFT ARM  Final   Special Requests   Final    BOTTLES DRAWN AEROBIC ONLY Blood Culture adequate volume   Culture   Final    NO GROWTH 4 DAYS Performed at [facility], [place] [address] [place], [place] [address]    Report Status PENDING  Incomplete  Blood culture (routine x 2)     Status: None (Preliminary result)   Collection Time: 09/13/20  1:35 PM   Specimen: BLOOD LEFT HAND  Result Value Ref Range Status   Specimen Description BLOOD LEFT HAND  Final   Special Requests   Final    BOTTLES DRAWN AEROBIC AND ANAEROBIC Blood Culture results may not be optimal due to an inadequate volume of blood received in culture bottles   Culture   Final    NO GROWTH 4 DAYS Performed at [facility], [place] [address], [place],  [address]    Report Status PENDING  Incomplete      Radiology Studies: No results found.  Scheduled Meds: . heparin  5,000 Units Subcutaneous Q8H   Continuous Infusions:    LOS: 3 days   Time spent: 28 minutes   Darliss Cheney, MD [facility]  09/17/2020, 11:02 AM   To contact the attending provider between 7A-7P or the covering provider during after hours 7P-7A, please log into  the web site [email or web address].

## 2020-09-17 NOTE — TOC Progression Note (Addendum)
Transition of Care [facility]) - Progression Note    Patient Details  Name: DARION JUHASZ MRN: [id number] Date of Birth: 04-23-1946  Transition of Care [facility]) CM/SW [place], [place] Phone Number: 09/17/2020, 2:57 PM  Clinical Narrative:    CSW received update that [facility] was now not a safe option for pt and that SNF was recommended. CSW noted that pt is not oriented today. Son, Glendell Docker was contacted and is in agreement with short term SNF placement. He noted that he was interested in [facility], but was agreeable to a fax out in the [place] area. He advised that pt has been fully vaccinated. He confirmed that pt's cousin Gatha Mayer would also be a good contact, [phone]. CSW will complete FL2 and faxout, will follow for bed offers. Insurance authorization started, covid test requested.   Expected Discharge Plan: Skilled Nursing Facility Barriers to Discharge: Continued Medical Work up,SNF Pending bed offer,Insurance Authorization  Expected Discharge Plan and Services Expected Discharge Plan: [place] In-house Referral: Clinical Social Work Discharge Planning Services: CM Consult Post Acute Care Choice: [place] Living arrangements for the past 2 months: Single Family Home                 DME Arranged: N/A DME Agency: NA       HH Arranged: RN,PT,OT           Social Determinants of Health (SDOH) Interventions    Readmission Risk Interventions No flowsheet data found.

## 2020-09-18 DIAGNOSIS — T68XXXD Hypothermia, subsequent encounter: Secondary | ICD-10-CM | POA: Diagnosis not present

## 2020-09-18 LAB — CULTURE, BLOOD (ROUTINE X 2)
Culture: NO GROWTH
Culture: NO GROWTH
Special Requests: ADEQUATE

## 2020-09-18 LAB — SARS CORONAVIRUS 2 (TAT 6-24 HRS): SARS Coronavirus 2: NEGATIVE

## 2020-09-18 NOTE — TOC Progression Note (Signed)
Transition of Care [facility]) - Progression Note    Patient Details  Name: [name] MRN: [id number] Date of Birth: 1946/05/17  Transition of Care [facility]) CM/SW [place], [place] Phone Number: 09/18/2020, 10:30 AM  Clinical Narrative:     CSW received bed offer from [facility], Juliann Pulse noted they would maybe have a bed tomorrow. Attempted to update pt's son and cousin, VM left. Covid pending, Insurance auth received. Ref # [id number] 2/7- 2/9 Arist Dagout CM. Will update pt's son or cousin about bed offer when able.   Expected Discharge Plan: Skilled Nursing Facility Barriers to Discharge: Continued Medical Work up,SNF Pending bed offer,Insurance Authorization  Expected Discharge Plan and Services Expected Discharge Plan: [place] In-house Referral: Clinical Social Work Discharge Planning Services: CM Consult Post Acute Care Choice: [place] Living arrangements for the past 2 months: Single Family Home                 DME Arranged: N/A DME Agency: NA       HH Arranged: RN,PT,OT           Social Determinants of Health (SDOH) Interventions    Readmission Risk Interventions No flowsheet data found.

## 2020-09-18 NOTE — Progress Notes (Signed)
PROGRESS NOTE    [name]  [id number] DOB: 1945/09/05 DOA: 09/13/2020 PCP: Velna Hatchet, MD   Brief Narrative:  [name] is a 75 y.o. female past medical history of essential hypertension presented to hospital for atypical chest pain.  Upon presentation to ED, she was moderately hypothermic with bradycardia and mild hypotension.  Her heating system is working at home, she has not slept outside, she has not consumed any alcohol. She was also confused upon presentation.  She was admitted for further management.  Cardiac enzymes were slightly elevated but flat, not indicative of ACS.  Temperature improved.  Electrolytes replaced.  She has improved however still has some intermittent cognitive issues/confusion at times.  She likely has underlying undiagnosed dementia for which we recommend she sees neurology outpatient for formal evaluation.  She was evaluated by PT OT who recommends home health PT with 24-hour supervision or SNF.  Has a son who lives in town but he is not willing to provide 24-hour care.  Working on finding a placement.  Assessment & Plan:   Active Problems:   Hypothermia   Sinus bradycardia   Essential hypertension  Hypothermia: Patient presented with an episode of hypothermia. No source identified.  Patient is back to normal.  Chest pain: Patient came in with atypical chest pain.  Cardiac enzymes negative.  No more chest pain currently.  Sinus bradycardia: Resolved.  Essential hypertension: Blood pressure within normal range.  Continue to hold antihypertensives.  Possible early signs of dementia: Patient has some cognitive issues.  However today she is fully alert and oriented.  She has signs of early dementia which can be observed intermittently.  Hypokalemia: Resolved  Generalized weakness/poor cognition: Patient was seen by PT OT who recommended discharging home with 24-hour supervision or discharging to SNF.  Patient son is not willing to provide  24-hour care.  Patient was hoping that her cousin will either live with her or she will live with a cousin however she faced disappointment there as well today and finally she has agreed to go to SNF.  Patient is improving as far as her physical strength was but still requires home health with 24-hour care.  Patient alert and oriented today.  Willing to go to SNF.  Per TOC note, she has a bed offer and can be discharged tomorrow.  Covid test ordered.  DVT prophylaxis: heparin injection 5,000 Units Start: 09/13/20 2200   Code Status: Full Code  Family Communication: None present at bedside.  Status is: Observation  The patient will require care spanning > 2 midnights and should be moved to inpatient because: Unsafe d/c plan  Dispo: The patient is from: Home              Anticipated d/c is to: Home              Anticipated d/c date is: 1 day              Patient currently is medically stable to d/c.   Difficult to place patient yes        Estimated body mass index is 34.38 kg/m as calculated from the following:   Height as of this encounter: 5\' 6"  (1.676 m).   Weight as of this encounter: 96.6 kg.      Nutritional status:               Consultants:   None  Procedures:   None  Antimicrobials:  Anti-infectives (From admission, onward)  Start     Dose/Rate Route Frequency Ordered Stop   09/13/20 1315  cefTRIAXone (ROCEPHIN) 1 g in sodium chloride 0.9 % 100 mL IVPB  Status:  Discontinued        1 g 200 mL/hr over 30 Minutes Intravenous  Once 09/13/20 1304 09/13/20 1342         Subjective: Patient seen and examined.  She is awake, alert and oriented.  No complaints.    Objective: Vitals:   09/17/20 1655 09/17/20 2005 09/18/20 0517 09/18/20 0913  BP: 114/63 (!) 123/59 125/67 123/65  Pulse: 66 67 68 60  Resp: 16 17 14 18   Temp: 97.8 F (36.6 C) (!) 97.5 F (36.4 C) 97.9 F (36.6 C) 97.7 F (36.5 C)  TempSrc: Oral Oral  Oral  SpO2: 96% 99% 98% 95%   Weight:      Height:        Intake/Output Summary (Last 24 hours) at 09/18/2020 1130 Last data filed at 09/18/2020 0900 Gross per 24 hour  Intake 600 ml  Output 0 ml  Net 600 ml   Filed Weights   09/13/20 0728  Weight: 96.6 kg    Examination:  General exam: Appears calm and comfortable  Respiratory system: Clear to auscultation. Respiratory effort normal. Cardiovascular system: S1 & S2 heard, RRR. No JVD, murmurs, rubs, gallops or clicks. No pedal edema. Gastrointestinal system: Abdomen is nondistended, soft and nontender. No organomegaly or masses felt. Normal bowel sounds heard. Central nervous system: Alert and oriented. No focal neurological deficits. Extremities: Symmetric 5 x 5 power. Skin: No rashes, lesions or ulcers.  Psychiatry: Judgement and insight appear poor   Data Reviewed: I have personally reviewed following labs and imaging studies  CBC: Recent Labs  Lab 09/13/20 0730 09/13/20 2112 09/14/20 0621 09/15/20 0907  WBC 4.2 4.8 6.5 6.9  NEUTROABS  --   --   --  3.5  HGB 14.0 12.3 11.5* 11.4*  HCT 44.4 37.5 34.4* 34.9*  MCV 84.4 81.2 81.7 81.4  PLT 231 224 167 824   Basic Metabolic Panel: Recent Labs  Lab 09/13/20 0730 09/13/20 2112 09/14/20 0621 09/15/20 0907 09/16/20 1002  NA 138  --  139 140 142  K 3.6  --  3.6 3.3* 3.7  CL 101  --  107 105 107  CO2 21*  --  20* 25 25  GLUCOSE 139*  --  64* 71 99  BUN 25*  --  22 19 14   CREATININE 0.87 0.90 0.82 0.88 0.98  CALCIUM 9.7  --  8.2* 8.9 9.1  MG  --   --   --  1.7  --    GFR: Estimated Creatinine Clearance: 60.8 mL/min (by C-G formula based on SCr of 0.98 mg/dL). Liver Function Tests: Recent Labs  Lab 09/14/20 0621  AST 31  ALT 19  ALKPHOS 86  BILITOT 0.9  PROT 5.6*  ALBUMIN 2.7*   Recent Labs  Lab 09/13/20 1417  LIPASE 19   Recent Labs  Lab 09/14/20 0643  AMMONIA 31   Coagulation Profile: No results for input(s): INR, PROTIME in the last 168 hours. Cardiac Enzymes: Recent  Labs  Lab 09/13/20 1417  CKTOTAL 102   BNP (last 3 results) No results for input(s): PROBNP in the last 8760 hours. HbA1C: No results for input(s): HGBA1C in the last 72 hours. CBG: No results for input(s): GLUCAP in the last 168 hours. Lipid Profile: No results for input(s): CHOL, HDL, LDLCALC, TRIG, CHOLHDL, LDLDIRECT in the last  72 hours. Thyroid Function Tests: No results for input(s): TSH, T4TOTAL, FREET4, T3FREE, THYROIDAB in the last 72 hours. Anemia Panel: No results for input(s): VITAMINB12, FOLATE, FERRITIN, TIBC, IRON, RETICCTPCT in the last 72 hours. Sepsis Labs: Recent Labs  Lab 09/13/20 7829  LATICACIDVEN 1.9    Recent Results (from the past 240 hour(s))  SARS Coronavirus 2 by RT PCR (hospital order, performed in [facility] hospital lab) Nasopharyngeal Nasopharyngeal Swab     Status: None   Collection Time: 09/13/20  1:12 PM   Specimen: Nasopharyngeal Swab  Result Value Ref Range Status   SARS Coronavirus 2 NEGATIVE NEGATIVE Final    Comment: (NOTE) SARS-CoV-2 target nucleic acids are NOT DETECTED.  The SARS-CoV-2 RNA is generally detectable in upper and lower respiratory specimens during the acute phase of infection. The lowest concentration of SARS-CoV-2 viral copies this assay can detect is 250 copies / mL. A negative result does not preclude SARS-CoV-2 infection and should not be used as the sole basis for treatment or other patient management decisions.  A negative result may occur with improper specimen collection / handling, submission of specimen other than nasopharyngeal swab, presence of viral mutation(s) within the areas targeted by this assay, and inadequate number of viral copies (<250 copies / mL). A negative result must be combined with clinical observations, patient history, and epidemiological information.  Fact Sheet for Patients:   StrictlyIdeas.no  Fact Sheet for Healthcare  Providers: BankingDealers.co.za  This test is not yet approved or  cleared by the [place] FDA and has been authorized for detection and/or diagnosis of SARS-CoV-2 by FDA under an Emergency Use Authorization (EUA).  This EUA will remain in effect (meaning this test can be used) for the duration of the COVID-19 declaration under Section 564(b)(1) of the Act, 21 U.S.C. section 360bbb-3(b)(1), unless the authorization is terminated or revoked sooner.  Performed at [facility], [place] [address], [place], [place] [address]   Blood culture (routine x 2)     Status: None (Preliminary result)   Collection Time: 09/13/20  1:34 PM   Specimen: BLOOD LEFT ARM  Result Value Ref Range Status   Specimen Description BLOOD LEFT ARM  Final   Special Requests   Final    BOTTLES DRAWN AEROBIC ONLY Blood Culture adequate volume   Culture   Final    NO GROWTH 4 DAYS Performed at [facility], [place] [address] [place], [place] [address]    Report Status PENDING  Incomplete  Blood culture (routine x 2)     Status: None (Preliminary result)   Collection Time: 09/13/20  1:35 PM   Specimen: BLOOD LEFT HAND  Result Value Ref Range Status   Specimen Description BLOOD LEFT HAND  Final   Special Requests   Final    BOTTLES DRAWN AEROBIC AND ANAEROBIC Blood Culture results may not be optimal due to an inadequate volume of blood received in culture bottles   Culture   Final    NO GROWTH 4 DAYS Performed at [facility], [place] [address], [place], [place] [address]    Report Status PENDING  Incomplete      Radiology Studies: No results found.  Scheduled Meds: . heparin  5,000 Units Subcutaneous Q8H   Continuous Infusions:    LOS: 4 days   Time spent: 25 minutes   Darliss Cheney, MD [facility]  09/18/2020, 11:30 AM   To contact the attending provider between 7A-7P or the covering provider  during after hours 7P-7A, please log into the web site  [email or web address].

## 2020-09-19 DIAGNOSIS — T68XXXD Hypothermia, subsequent encounter: Secondary | ICD-10-CM | POA: Diagnosis not present

## 2020-09-19 DIAGNOSIS — I10 Essential (primary) hypertension: Secondary | ICD-10-CM | POA: Diagnosis not present

## 2020-09-19 NOTE — Discharge Instructions (Signed)
Hypothermia Hypothermia is a drop in body temperature to 18F (35C) or lower. The average body temperature is between 22F (36.1C) and 66F (37.2C). Hypothermia is life-threatening because your organs cannot work properly when your temperature is too low. What are the causes? This condition may be caused by:  Being in cold environments without being dressed warmly enough (environmental exposure).  Getting wet, such as from rain or snow, or from falling into cold water.  Having medical conditions that affect body temperature, such as hypothyroidism, diabetes, Parkinson's disease, or neurological conditions.  Taking medicines that affect the body's ability to control temperature.  Getting injured outdoors, such as from drowning or being caught in an avalanche. What increases the risk? This condition is more likely to develop in:  People who are outside for long periods of time, such as hikers, hunters, or people who are homeless. This also includes those who work outdoors.  People who use substances such as sedatives, narcotics, mood altering substances, or take medicines that affect the body's ability to control temperature.  People who have difficulty moving around (impaired mobility).  Babies.  Older adults.  People who have chronic kidney, heart or lung conditions, or who have a medical condition that affects body temperature.  People who drink too much alcohol or who drink it too often. What are the signs or symptoms? At first, symptoms of hypothermia may include:  Cool or cold skin and pale skin.  Shivering. This happen early in hypothermia and may stop as the condition gets worse. Hypothermia does not always cause shivering.  Slow thinking, speech, and response time.  Sleepiness.  Confusion.  A puffy or swollen face.  Numbness.  In infants, skin that is red and cold. As hypothermia gets worse, the following symptoms may develop:  Uncoordinated  movements.  Significant confusion or inability to form words.  Slow, shallow breathing.  Slow, weak pulse.  Loss of consciousness.  Cardiac arrest. How is this diagnosed? This condition is diagnosed by taking your temperature and having a history of cold. You may also have tests, such as:  Blood tests.  Blood sugar (glucose) tests.  Urine tests.  Liver tests.  Heart tests, such as an electrocardiogram (ECG) and continuous cardiac monitoring. How is this treated? Hypothermia is an emergency that needs to be treated in a hospital. The first priority is to provide a warm environment immediately. This always means getting out of the cold exposure, and out of cold or wet clothing. Treatment for hypothermia depends on how severe it is. Treatment may include:  Receiving IV fluids. These fluids may be warmed.  Warming. Warm blankets or special fans and heating devices that circulate warm air may be used. Heating pads or packs on the body may be used as well.  Receiving warm, humidified oxygen through tubing (nasal cannula), an oxygen mask, or, in very severe cases, a breathing tube.  Receiving a warm solution through a small tube that goes into the stomach or bladder. In severe cases, warm liquids may also be used over the intestines (cavity lavage).  Rewarming the blood by removing it, passing it through a hemodialysis machine and returning it to the body at gradually increasing temperatures. This is sometimes done in the most severe cases. It is important to seek treatment as soon as possible.   How is this prevented?  If you have an increased risk of developing hypothermia, keep your home at 57F (20C) or warmer.  Stay alert for dangerous weather situations and plan  accordingly. Keep warm items in your house and car and observe safety precautions from [place] or weather officials.  Get out of cold environments, including water, right away and replace wet clothing with dry  clothing as soon as possible.  Do not ignore signs that you are cold, such as shivering, cold feet and hands, and numbness or trouble speaking.  If you know you will be in a cold environment: ? Wear appropriate layers of clothing. ? Wear a waterproof jacket if you are outside in the rain or snow. ? Avoid spending long periods of time outside without access to a warm environment. ? Do not drink alcohol. Alcohol causes your blood vessels to get larger (dilate) which makes you lose body heat. ? Bring extra warming supplies with you. This includes extra dry clothes, blankets, and portable heaters, if possible.   Contact a health care provider if:  You have signs of hypothermia, including: ? Shivering for a long time. ? Cold, pale skin. ? Confusion. ? Numbness. ? A bluish color of the face, hands, and feet. Get help right away if:  You have severe confusion, a bad headache, or you feel very sleepy.  You shiver uncontrollably, or you stop shivering even though you are still cold.  You develop chest pain, shortness of breath, headache, dizziness, or lightheadedness.  You develop severe numbness or discoloration of your face, hands, or feet. These symptoms may represent a serious problem that is an emergency. Do not wait to see if the symptoms will go away. Get medical help right away. Call your local emergency services (911 in the [place] Do not drive yourself to the hospital. Summary  Hypothermia is a drop in body temperature to 64F (35C) or lower.  Hypothermia is life-threatening because your organs cannot work properly when your temperature is too low.  Symptoms may range from mild to extremely severe. Symptoms may include shivering, skin color changes, tiredness, confusion, chest pain, and shortness of breath.  Treatment for hypothermia depends on how severe it is. The first priority is to provide a warm environment immediately. This always means getting out of the cold environment,  and out of cold or wet clothing. This information is not intended to replace advice given to you by your health care provider. Make sure you discuss any questions you have with your health care provider. Document Revised: 11/10/2019 Document Reviewed: 11/10/2019 Elsevier Patient Education  [place].

## 2020-09-19 NOTE — Discharge Summary (Signed)
Physician Discharge Summary  [name] [id number] DOB: 16-Jan-1946 DOA: 09/13/2020  PCP: Velna Hatchet, MD  Admit date: 09/13/2020 Discharge date: 09/19/2020 30 Day Unplanned Readmission Risk Score   Flowsheet Row ED to Hosp-Admission (Current) from 09/13/2020 in [facility]  30 Day Unplanned Readmission Risk Score (%) 7.83 Filed at 09/19/2020 0801     This score is the patient's risk of an unplanned readmission within 30 days of being discharged (0 -100%). The score is based on dignosis, age, lab data, medications, orders, and past utilization.   Low:  0-14.9   Medium: 15-21.9   High: 22-29.9   Extreme: 30 and above         Admitted From: home Disposition:  snf  Recommendations for Outpatient Follow-up:  1. Follow up with PCP in 1-2 weeks 2. Follow-up with neurology outpatient in 2 to 4 weeks for formal evaluation of possible underlying dementia. 3. Please obtain BMP/CBC in one week 4. Please follow up with your PCP on the following pending results: Unresulted Labs (From admission, onward)          Start     Ordered   09/13/20 1347  Vitamin B1  Once,   STAT        09/13/20 1355   09/13/20 0834  Urinalysis, Routine w reflex microscopic Urine, Clean Catch  ONCE - STAT,   STAT        09/13/20 0833            Home Health:none Equipment/Devices:none  Discharge Condition: Stable CODE STATUS: Full code Diet recommendation: Cardiac  Subjective: Seen and examined.  She has no complaints.  She is alert and oriented.  Brief/Interim Summary: Adriana I Rankinis a 75 y.o.femalepast medical history of essential hypertension presented to hospital for atypical chest pain.  Upon presentation to ED, she was moderately hypothermic with bradycardia and mild hypotension.  Her heating system is working at home, she has not slept outside, she has not consumed any alcohol. She was also confused upon presentation.  She was admitted for further management.  Cardiac enzymes  were slightly elevated but flat, not indicative of ACS.  Temperature improved.  Electrolytes replaced.  She has improved however still has some intermittent cognitive issues/confusion at times.  She likely has underlying undiagnosed dementia for which we recommend she sees neurology outpatient for formal evaluation.  She was evaluated by PT OT who recommends home health PT with 24-hour supervision or SNF.  Has a son who lives in town but he is not willing to provide 24-hour care.  Finally patient agreed to go to SNF which has been arranged for her so she is going to be discharged today in stable condition.  Discharge Diagnoses:  Active Problems:   Hypothermia   Sinus bradycardia   Essential hypertension    Discharge Instructions   Allergies as of 09/19/2020      Reactions   Shellfish Allergy       Medication List    TAKE these medications   amLODipine-valsartan 10-160 MG tablet Commonly known as: EXFORGE Take 1 tablet by mouth daily.       Contact information for follow-up providers    [facility] Follow up.   Specialty: Home Health Services Why: the office will call to schedule visits Contact information: [place] [address] [phone]        Velna Hatchet, MD Follow up in 1 week(s).   Specialty: Internal Medicine Contact information:  [place] [address] [phone]            Contact information for after-discharge care    Destination    [facility] Preferred SNF .   Service: Skilled Nursing Contact information: [address] [address] [phone]                 Allergies  Allergen Reactions  . Shellfish Allergy     Consultations: None   Procedures/Studies: DG Chest 2 View  Result Date: 09/13/2020 CLINICAL DATA:  Chest pain EXAM: CHEST - 2 VIEW COMPARISON:  01/17/2005 FINDINGS: Postoperative right axilla.  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  Normal heart size and stable aortic tortuosity. Small or moderate hiatal hernia containing gas. There is no edema, consolidation, effusion, or pneumothorax. IMPRESSION: No evidence of acute disease. Electronically Signed   By: [place] Fantasia M.D.   On: 09/13/2020 07:58   CT HEAD WO CONTRAST  Result Date: 09/14/2020 CLINICAL DATA:  Delirium EXAM: CT HEAD WITHOUT CONTRAST TECHNIQUE: Contiguous axial images were obtained from the base of the skull through the vertex without intravenous contrast. COMPARISON:  07/30/2007 brain MRI FINDINGS: Brain: No evidence of acute infarction, hemorrhage, hydrocephalus, extra-axial collection or mass lesion/mass effect. 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 Small remote cortical infarcts at the high right frontal parietal convexity and in the deep left cerebral white matter. Vascular: A-comm region aneurysm coiling. Skull: Negative Sinuses/Orbits: No acute finding. Partial coverage of prior right mid face metallic suture IMPRESSION: 1. No acute finding or change from 2008. 2. Remote infarcts as described. Electronically Signed   By: [place] Fantasia M.D.   On: 09/14/2020 04:15      Discharge Exam: Vitals:   09/18/20 2045 09/19/20 0509  BP: 119/73 139/81  Pulse: 68 76  Resp: 18 16  Temp: 97.6 F (36.4 C) 97.7 F (36.5 C)  SpO2: 100% 94%   Vitals:   09/18/20 0913 09/18/20 1642 09/18/20 2045 09/19/20 0509  BP: 123/65 (!) 120/57 119/73 139/81  Pulse: 60 67 68 76  Resp: 18 18 18 16   Temp: 97.7 F (36.5 C) 97.7 F (36.5 C) 97.6 F (36.4 C) 97.7 F (36.5 C)  TempSrc: Oral Oral Oral Oral  SpO2: 95% 93% 100% 94%  Weight:      Height:        General: Pt is alert, awake, not in acute distress Cardiovascular: RRR, S1/S2 +, no rubs, no gallops Respiratory: CTA bilaterally, no wheezing, no rhonchi Abdominal: Soft, NT, ND, bowel sounds + Extremities: no edema, no cyanosis    The results of significant diagnostics from this  hospitalization (including imaging, microbiology, ancillary and laboratory) are listed below for reference.     Microbiology: Recent Results (from the past 240 hour(s))  SARS Coronavirus 2 by RT PCR (hospital order, performed in [facility] hospital lab) Nasopharyngeal Nasopharyngeal Swab     Status: None   Collection Time: 09/13/20  1:12 PM   Specimen: Nasopharyngeal Swab  Result Value Ref Range Status   SARS Coronavirus 2 NEGATIVE NEGATIVE Final    Comment: (NOTE) SARS-CoV-2 target nucleic acids are NOT DETECTED.  The SARS-CoV-2 RNA is generally detectable in upper and lower respiratory specimens during the acute phase of infection. The lowest concentration of SARS-CoV-2 viral copies this assay can detect is 250 copies / mL. A negative result does not preclude SARS-CoV-2 infection and should not be used as the sole basis for treatment or other patient  management decisions.  A negative result may occur with improper specimen collection / handling, submission of specimen other than nasopharyngeal swab, presence of viral mutation(s) within the areas targeted by this assay, and inadequate number of viral copies (<250 copies / mL). A negative result must be combined with clinical observations, patient history, and epidemiological information.  Fact Sheet for Patients:   StrictlyIdeas.no  Fact Sheet for Healthcare Providers: BankingDealers.co.za  This test is not yet approved or  cleared by the [place] FDA and has been authorized for detection and/or diagnosis of SARS-CoV-2 by FDA under an Emergency Use Authorization (EUA).  This EUA will remain in effect (meaning this test can be used) for the duration of the COVID-19 declaration under Section 564(b)(1) of the Act, 21 U.S.C. section 360bbb-3(b)(1), unless the authorization is terminated or revoked sooner.  Performed at [facility], [place] [address], [place],  [place] [address]   Blood culture (routine x 2)     Status: None   Collection Time: 09/13/20  1:34 PM   Specimen: BLOOD LEFT ARM  Result Value Ref Range Status   Specimen Description BLOOD LEFT ARM  Final   Special Requests   Final    BOTTLES DRAWN AEROBIC ONLY Blood Culture adequate volume   Culture   Final    NO GROWTH 5 DAYS Performed at [facility], [place] [address], [place], [place] [address]    Report Status 09/18/2020 FINAL  Final  Blood culture (routine x 2)     Status: None   Collection Time: 09/13/20  1:35 PM   Specimen: BLOOD LEFT HAND  Result Value Ref Range Status   Specimen Description BLOOD LEFT HAND  Final   Special Requests   Final    BOTTLES DRAWN AEROBIC AND ANAEROBIC Blood Culture results may not be optimal due to an inadequate volume of blood received in culture bottles   Culture   Final    NO GROWTH 5 DAYS Performed at [facility], [place] [address] [place], [place] [address]    Report Status 09/18/2020 FINAL  Final  SARS CORONAVIRUS 2 (TAT 6-24 HRS) Nasopharyngeal Nasopharyngeal Swab     Status: None   Collection Time: 09/18/20  9:00 AM   Specimen: Nasopharyngeal Swab  Result Value Ref Range Status   SARS Coronavirus 2 NEGATIVE NEGATIVE Final    Comment: (NOTE) SARS-CoV-2 target nucleic acids are NOT DETECTED.  The SARS-CoV-2 RNA is generally detectable in upper and lower respiratory specimens during the acute phase of infection. Negative results do not preclude SARS-CoV-2 infection, do not rule out co-infections with other pathogens, and should not be used as the sole basis for treatment or other patient management decisions. Negative results must be combined with clinical observations, patient history, and epidemiological information. The expected result is Negative.  Fact Sheet for Patients: SugarRoll.be  Fact Sheet for Healthcare Providers: https://www.woods-mathews.com/  This test is not yet  approved or cleared by the [place] FDA and  has been authorized for detection and/or diagnosis of SARS-CoV-2 by FDA under an Emergency Use Authorization (EUA). This EUA will remain  in effect (meaning this test can be used) for the duration of the COVID-19 declaration under Se ction 564(b)(1) of the Act, 21 U.S.C. section 360bbb-3(b)(1), unless the authorization is terminated or revoked sooner.  Performed at [facility], [place] [address] [place], [place] [address]      Labs: BNP (last 3 results) No results for input(s): BNP in  the last 8760 hours. Basic Metabolic Panel: Recent Labs  Lab 09/13/20 0730 09/13/20 2112 09/14/20 0621 09/15/20 0907 09/16/20 1002  NA 138  --  139 140 142  K 3.6  --  3.6 3.3* 3.7  CL 101  --  107 105 107  CO2 21*  --  20* 25 25  GLUCOSE 139*  --  64* 71 99  BUN 25*  --  22 19 14   CREATININE 0.87 0.90 0.82 0.88 0.98  CALCIUM 9.7  --  8.2* 8.9 9.1  MG  --   --   --  1.7  --    Liver Function Tests: Recent Labs  Lab 09/14/20 0621  AST 31  ALT 19  ALKPHOS 86  BILITOT 0.9  PROT 5.6*  ALBUMIN 2.7*   Recent Labs  Lab 09/13/20 1417  LIPASE 19   Recent Labs  Lab 09/14/20 0643  AMMONIA 31   CBC: Recent Labs  Lab 09/13/20 0730 09/13/20 2112 09/14/20 0621 09/15/20 0907  WBC 4.2 4.8 6.5 6.9  NEUTROABS  --   --   --  3.5  HGB 14.0 12.3 11.5* 11.4*  HCT 44.4 37.5 34.4* 34.9*  MCV 84.4 81.2 81.7 81.4  PLT 231 224 167 195   Cardiac Enzymes: Recent Labs  Lab 09/13/20 1417  CKTOTAL 102   BNP: Invalid input(s): POCBNP CBG: No results for input(s): GLUCAP in the last 168 hours. D-Dimer No results for input(s): DDIMER in the last 72 hours. Hgb A1c No results for input(s): HGBA1C in the last 72 hours. Lipid Profile No results for input(s): CHOL, HDL, LDLCALC, TRIG, CHOLHDL, LDLDIRECT in the last 72 hours. Thyroid function studies No results for input(s): TSH, T4TOTAL, T3FREE, THYROIDAB in the last 72 hours.  Invalid  input(s): FREET3 Anemia work up No results for input(s): VITAMINB12, FOLATE, FERRITIN, TIBC, IRON, RETICCTPCT in the last 72 hours. Urinalysis No results found for: COLORURINE, APPEARANCEUR, [place], [place], [place], [place], [place], [place], PROTEINUR, UROBILINOGEN, NITRITE, LEUKOCYTESUR Sepsis Labs Invalid input(s): PROCALCITONIN,  WBC,  LACTICIDVEN Microbiology Recent Results (from the past 240 hour(s))  SARS Coronavirus 2 by RT PCR (hospital order, performed in [facility] hospital lab) Nasopharyngeal Nasopharyngeal Swab     Status: None   Collection Time: 09/13/20  1:12 PM   Specimen: Nasopharyngeal Swab  Result Value Ref Range Status   SARS Coronavirus 2 NEGATIVE NEGATIVE Final    Comment: (NOTE) SARS-CoV-2 target nucleic acids are NOT DETECTED.  The SARS-CoV-2 RNA is generally detectable in upper and lower respiratory specimens during the acute phase of infection. The lowest concentration of SARS-CoV-2 viral copies this assay can detect is 250 copies / mL. A negative result does not preclude SARS-CoV-2 infection and should not be used as the sole basis for treatment or other patient management decisions.  A negative result may occur with improper specimen collection / handling, submission of specimen other than nasopharyngeal swab, presence of viral mutation(s) within the areas targeted by this assay, and inadequate number of viral copies (<250 copies / mL). A negative result must be combined with clinical observations, patient history, and epidemiological information.  Fact Sheet for Patients:   StrictlyIdeas.no  Fact Sheet for Healthcare Providers: BankingDealers.co.za  This test is not yet approved or  cleared by the [place] FDA and has been authorized for detection and/or diagnosis of SARS-CoV-2 by FDA under an Emergency Use Authorization (EUA).  This EUA will remain in effect (meaning this test can be used)  for the duration of the  COVID-19 declaration under Section 564(b)(1) of the Act, 21 U.S.C. section 360bbb-3(b)(1), unless the authorization is terminated or revoked sooner.  Performed at [facility], [place] [address], [place], [place] [address]   Blood culture (routine x 2)     Status: None   Collection Time: 09/13/20  1:34 PM   Specimen: BLOOD LEFT ARM  Result Value Ref Range Status   Specimen Description BLOOD LEFT ARM  Final   Special Requests   Final    BOTTLES DRAWN AEROBIC ONLY Blood Culture adequate volume   Culture   Final    NO GROWTH 5 DAYS Performed at [facility], [place] [address], [place], [place] [address]    Report Status 09/18/2020 FINAL  Final  Blood culture (routine x 2)     Status: None   Collection Time: 09/13/20  1:35 PM   Specimen: BLOOD LEFT HAND  Result Value Ref Range Status   Specimen Description BLOOD LEFT HAND  Final   Special Requests   Final    BOTTLES DRAWN AEROBIC AND ANAEROBIC Blood Culture results may not be optimal due to an inadequate volume of blood received in culture bottles   Culture   Final    NO GROWTH 5 DAYS Performed at [facility], [place] [address], [place], [place] [address]    Report Status 09/18/2020 FINAL  Final  SARS CORONAVIRUS 2 (TAT 6-24 HRS) Nasopharyngeal Nasopharyngeal Swab     Status: None   Collection Time: 09/18/20  9:00 AM   Specimen: Nasopharyngeal Swab  Result Value Ref Range Status   SARS Coronavirus 2 NEGATIVE NEGATIVE Final    Comment: (NOTE) SARS-CoV-2 target nucleic acids are NOT DETECTED.  The SARS-CoV-2 RNA is generally detectable in upper and lower respiratory specimens during the acute phase of infection. Negative results do not preclude SARS-CoV-2 infection, do not rule out co-infections with other pathogens, and should not be used as the sole basis for treatment or other patient management decisions. Negative results must be combined with clinical observations, patient history, and  epidemiological information. The expected result is Negative.  Fact Sheet for Patients: SugarRoll.be  Fact Sheet for Healthcare Providers: https://www.woods-mathews.com/  This test is not yet approved or cleared by the [place] FDA and  has been authorized for detection and/or diagnosis of SARS-CoV-2 by FDA under an Emergency Use Authorization (EUA). This EUA will remain  in effect (meaning this test can be used) for the duration of the COVID-19 declaration under Se ction 564(b)(1) of the Act, 21 U.S.C. section 360bbb-3(b)(1), unless the authorization is terminated or revoked sooner.  Performed at [facility], [place] [address], [place], [place] [address]      Time coordinating discharge: Over 30 minutes  SIGNED:   Darliss Cheney, MD  [facility] 09/19/2020, 8:09 AM  If 7PM-7AM, please contact night-coverage [email or web address]

## 2020-09-19 NOTE — Progress Notes (Signed)
Physical Therapy Treatment Patient Details Name: [name] MRN: [id number] DOB: 1946-06-14 Today's Date: 09/19/2020    History of Present Illness Pt is a 75 y/o female admitted secondary to hypothermia and chest pain. PMH includes HTN.    PT Comments    Pt received in bed, cooperative and pleasant. Pt oriented to place, month, and year. Pt reported that she didn't feel like she had her "land legs" this morning, but was able to complete bed mobility, transfers, and ambulation. Pt was supervision for bed mobility and minAx1 for sit to stand transfers, requiring assistance for power up into standing. Pt was min guard for ambulation ~175 ft with a straight cane on her right. Verbal cueing to maintain upright posture and keep head up during ambulation. Attempted horizontal head turns during ambulation, but pt had difficulty following directions. Increased time to respond to commands and questions. Pt left in chair with chair alarm on, all needs met, and call bell within reach.   Follow Up Recommendations  Home health PT;Supervision for mobility/OOB;SNF (If family unable to provide supervision/assistance in the home, recommend SNF)     Equipment Recommendations  None recommended by PT    Recommendations for Other Services       Precautions / Restrictions Precautions Precautions: Fall Restrictions Weight Bearing Restrictions: No    Mobility  Bed Mobility Overal bed mobility: Needs Assistance Bed Mobility: Supine to Sit     Supine to sit: Supervision     General bed mobility comments: supervision for safety  Transfers Overall transfer level: Needs assistance Equipment used: Straight cane Transfers: Sit to/from Stand Sit to Stand: Min assist         General transfer comment: Required minAx1 for power up into standing  Ambulation/Gait Ambulation/Gait assistance: Min guard Gait Distance (Feet): 175 Feet Assistive device: Straight cane Gait Pattern/deviations:  Step-through pattern;Decreased stride length;Narrow base of support;Trunk flexed Gait velocity: Decreased   General Gait Details: Min guard for safety, needed cueing to prevent flexed trunk and to look upright   Stairs             Wheelchair Mobility    Modified [place] (Stroke Patients Only)       Balance Overall balance assessment: Needs assistance Sitting-balance support: No upper extremity supported;Feet supported Sitting balance-Leahy Scale: Good     Standing balance support: Single extremity supported Standing balance-Leahy Scale: Fair Standing balance comment: Tends to lean forwards, requires UE support                            Cognition Arousal/Alertness: Awake/alert Behavior During Therapy: WFL for tasks assessed/performed Overall Cognitive Status: Within Functional Limits for tasks assessed Area of Impairment: Safety/judgement;Attention                   Current Attention Level: Selective Memory: Decreased short-term memory Following Commands: Follows one step commands consistently;Follows one step commands with increased time Safety/Judgement: Decreased awareness of safety Awareness: Intellectual Problem Solving: Slow processing;Decreased initiation;Requires verbal cues General Comments: Oriented to place, date, and year      Exercises      General Comments        Pertinent Vitals/Pain Pain Assessment: No/denies pain    Home Living                      Prior Function            PT Goals (current goals can  now be found in the care plan section) Acute Rehab PT Goals Patient Stated Goal: to go home PT Goal Formulation: With patient Time For Goal Achievement: 09/28/20 Potential to Achieve Goals: Good Progress towards PT goals: Progressing toward goals    Frequency    Min 3X/week      PT Plan Current plan remains appropriate    Co-evaluation              AM-PAC PT "6 Clicks" Mobility    Outcome Measure  Help needed turning from your back to your side while in a flat bed without using bedrails?: A Little Help needed moving from lying on your back to sitting on the side of a flat bed without using bedrails?: A Little Help needed moving to and from a bed to a chair (including a wheelchair)?: A Little Help needed standing up from a chair using your arms (e.g., wheelchair or bedside chair)?: A Little Help needed to walk in hospital room?: A Little Help needed climbing 3-5 steps with a railing? : A Little 6 Click Score: 18    End of Session Equipment Utilized During Treatment: Gait belt Activity Tolerance: Patient tolerated treatment well Patient left: with call bell/phone within reach;in chair;with chair alarm set Nurse Communication: Mobility status PT Visit Diagnosis: Unsteadiness on feet (R26.81);Muscle weakness (generalized) (M62.81)     Time:  -     Charges:                          Rosita Kea, SPT

## 2020-09-19 NOTE — Progress Notes (Signed)
Michelena I [name] to be discharged [facility] per MD order. Patient verbalized understanding.  Skin clean, dry and intact without evidence of skin break down, no evidence of skin tears noted. IV catheter discontinued intact. Site without signs and symptoms of complications. Dressing and pressure applied. Pt denies pain at the site currently. No complaints noted.  Patient free of lines, drains, and wounds.   Discharge packet assembled. An After Visit Summary (AVS) was printed and given to the EMS personnel. Patient escorted via stretcher and discharged to [facility] via ambulance. Report called to accepting facility; all questions and concerns addressed.   Suzy Bouchard, RN

## 2020-09-19 NOTE — Care Management Important Message (Signed)
Important Message  Patient Details  Name: [name] MRN: [id number] Date of Birth: Nov 02, 1945   Medicare Important Message Given:  Yes     Kaavya Puskarich P Indianna Boran 09/19/2020, 11:53 AM

## 2020-09-19 NOTE — TOC Transition Note (Signed)
Transition of Care [facility]) - CM/SW Discharge Note *Discharged to [facility]   Patient Details  Name: [name] MRN: [id number] Date of Birth: [birth year]  Transition of Care [facility]) CM/SW Contact:  Sable Feil, LCSW Phone Number: 09/19/2020, 4:22 PM   Clinical Narrative:  Patient medically stable for discharge and going to [facility] for [place] rehab. Discharge clinicals transmitted to facility and non-emergency ambulance transport arranged. Son Moreen, [place]. contacted [phone]) and advised of discharge and ambulance transport.    Final next level of care: [facility] ([place] - Room 122B) Barriers to Discharge: Barriers Resolved   Patient Goals and CMS Choice Patient states their goals for this hospitalization and ongoing recovery are:: Patient and family agreeable to [place] rehab CMS Medicare.gov Compare Post Acute Care list provided to:: Patient Represenative (must comment) Choice offered to / list presented to : Patient  Discharge Placement   Existing PASRR number confirmed : 09/17/20          Patient chooses bed at: [facility] Patient to be transferred to facility by: Non-emergency ambulance transport Name of family member notified: Son - Kirstina, [place]. - [phone] Patient and family notified of of transfer: 09/19/20  Discharge Plan and Services In-house Referral: Clinical Social Work Discharge Planning Services: CM Consult Post Acute Care Choice: [place]          DME Arranged: N/A DME Agency: NA       HH Arranged: RN,PT,OT         Social Determinants of Health (SDOH) Interventions  No SDOH interventions requested or needed at discharge   Readmission Risk Interventions No flowsheet data found.

## 2020-09-20 NOTE — Progress Notes (Signed)
Report given to Clinical biochemist at [facility]. Questions welcomed and answered.

## 2020-09-27 LAB — VITAMIN B1: Vitamin B1 (Thiamine): 74.9 nmol/L (ref 66.5–200.0)

## 2021-07-07 IMAGING — CT CT HEAD W/O CM
4 series · 17 of 47 positions shown, 19 images · non-contrast
Comparison: 07/30/2007 brain MRI

CLINICAL DATA: Delirium

EXAM:
CT HEAD WITHOUT CONTRAST
TECHNIQUE: Contiguous axial images were obtained from the base of the skull
through the vertex without intravenous contrast.

[Series 3: head without · axial · non-contrast · 0.42mm/px · z∈[-162,-42]mm · 7 of 33 slices shown, 9 images]
[im 5/33  brain]
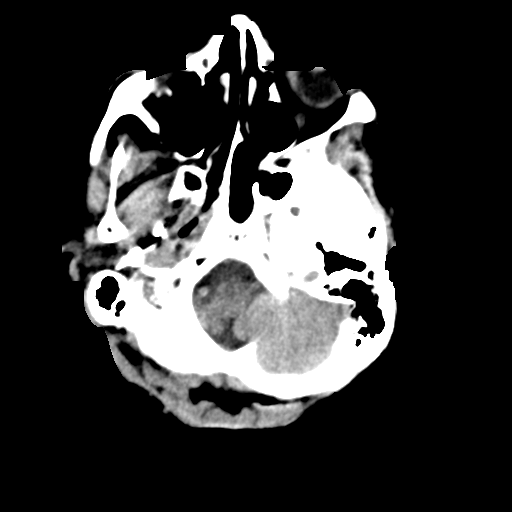
[im 5/33  bone]
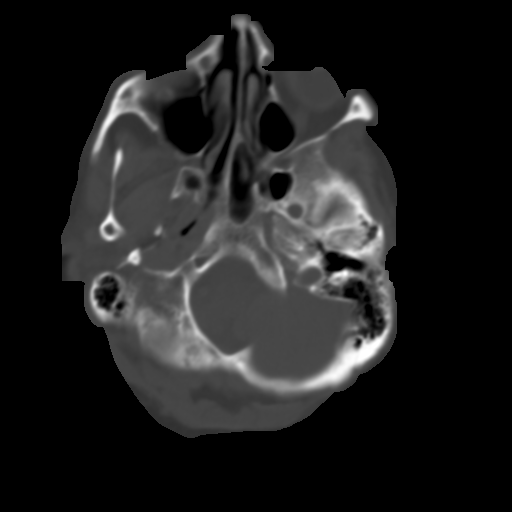
[im 9/33  brain]
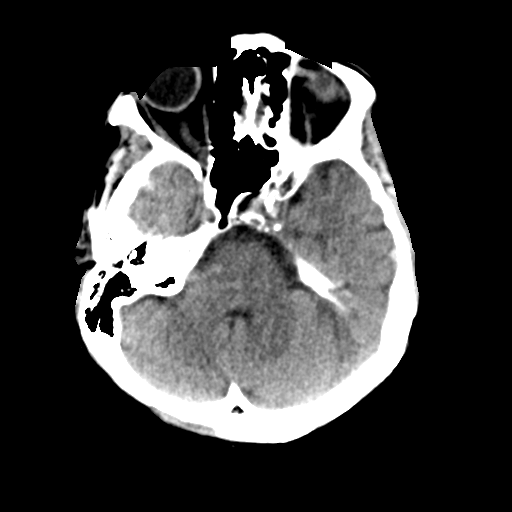
[im 13/33  brain]
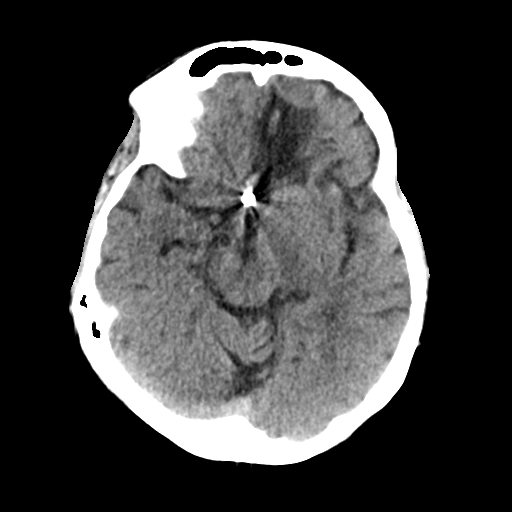
[im 17/33  brain]
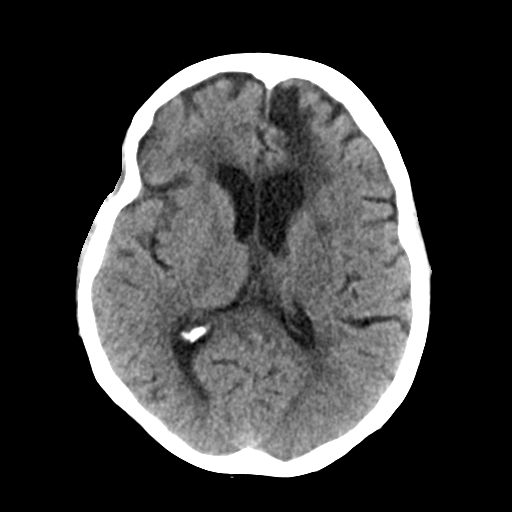
[im 21/33  brain]
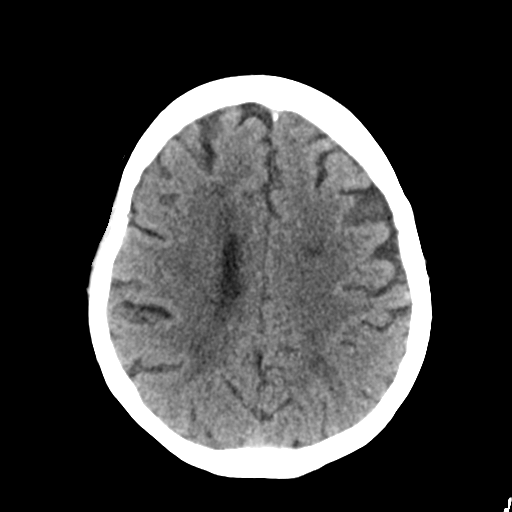
[im 21/33  bone]
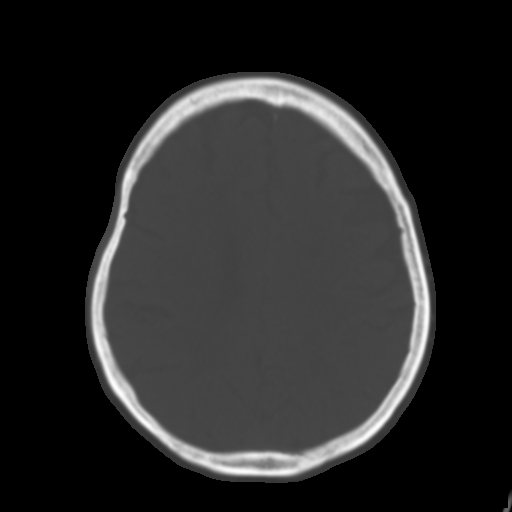
[im 25/33  brain]
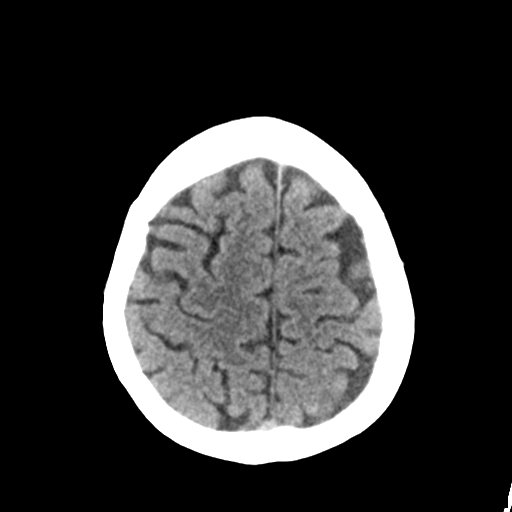
[im 29/33  brain]
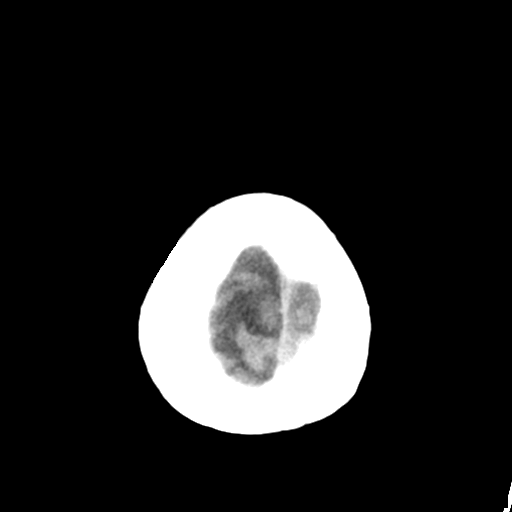

[Series 4: head bone · axial · 0.42mm/px · z∈[-166,-110]mm · 4 of 82 slices shown]
[im 9/82  bone]
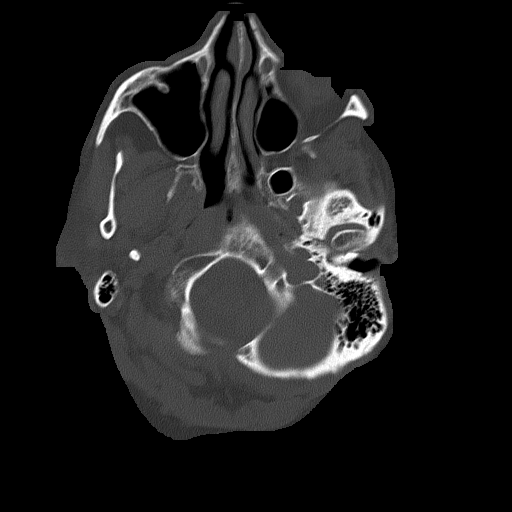
[im 17/82  bone]
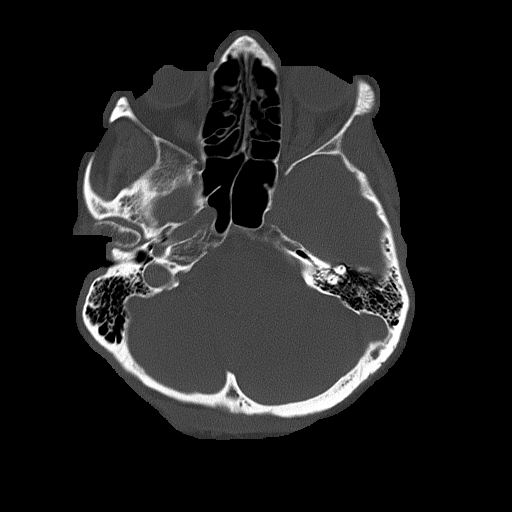
[im 25/82  bone]
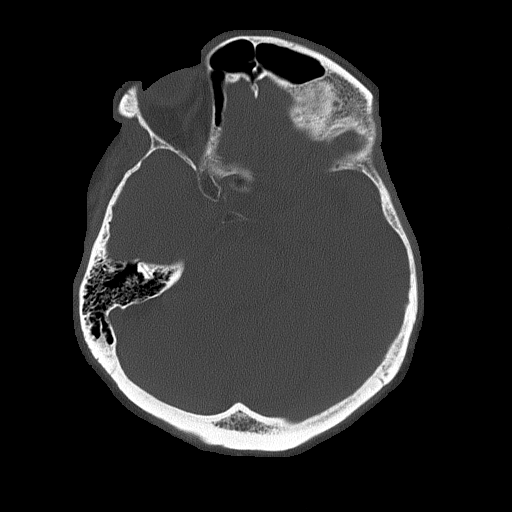
[im 37/82  bone]
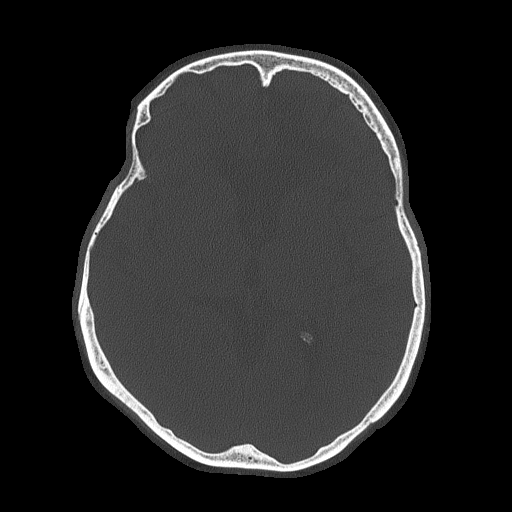

[Series 5: head without cor · coronal · non-contrast · 0.32mm/px · 3 of 67 slices shown]
[im 23/67  brain]
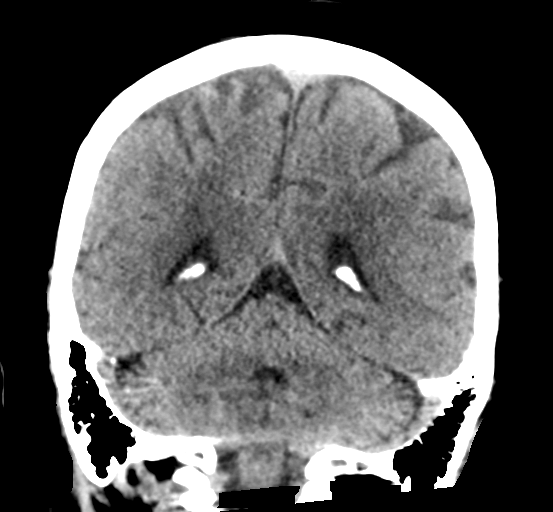
[im 30/67  brain]
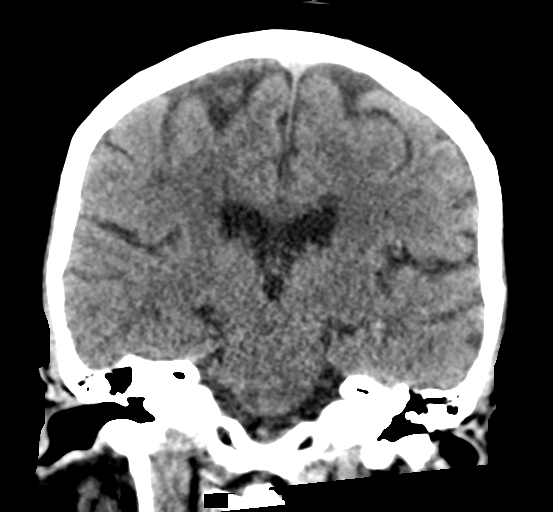
[im 37/67  brain]
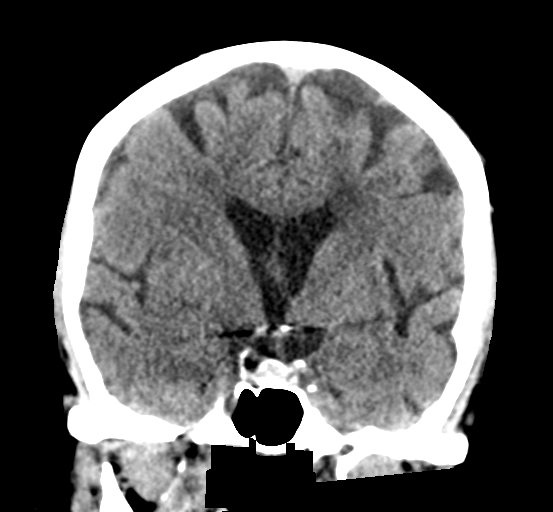

[Series 6: head without sag · sagittal · non-contrast · 0.32mm/px · 3 of 55 slices shown]
[im 21/55  brain]
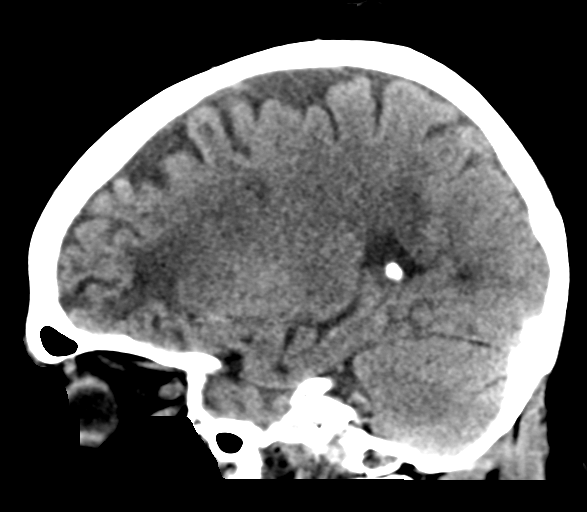
[im 28/55  brain]
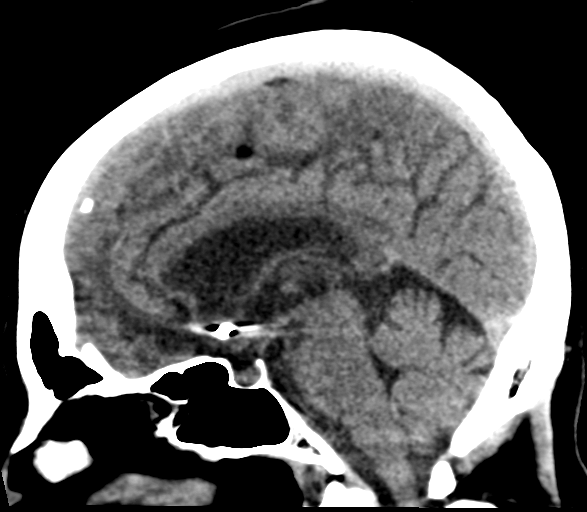
[im 34/55  brain]
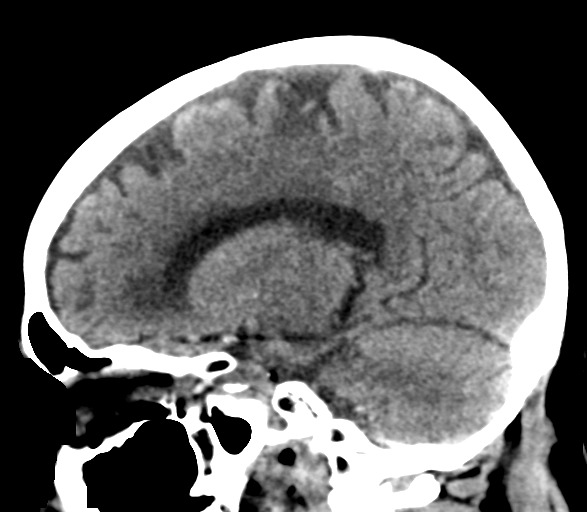

[17 of 47 positions shown; findings below may reference images not displayed]

FINDINGS: Brain: No evidence of acute infarction, hemorrhage, hydrocephalus,
extra-axial collection or mass lesion/mass effect. Remote left ACA
branch infarct in the frontal polar region. Small remote cortical
infarcts at the high right frontal parietal convexity and in the
deep left cerebral white matter.

Vascular: A-comm region aneurysm coiling.

Skull: Negative

Sinuses/Orbits: No acute finding. Partial coverage of prior right
mid face metallic suture
IMPRESSION: 1. No acute finding or change from 4118.
2. Remote infarcts as described.

## 2021-11-10 ENCOUNTER — Other Ambulatory Visit: Payer: Self-pay | Admitting: Gastroenterology

## 2021-12-22 ENCOUNTER — Encounter (HOSPITAL_COMMUNITY): Payer: Self-pay | Admitting: Gastroenterology

## 2021-12-22 NOTE — Progress Notes (Signed)
Attempted to obtain medical history via telephone, unable to reach at this time. I left a voicemail to return pre surgical testing department's phone call.  

## 2021-12-28 NOTE — Anesthesia Preprocedure Evaluation (Addendum)
Anesthesia Evaluation  Patient identified by MRN, date of birth, ID band Patient awake    Reviewed: Allergy & Precautions, NPO status , Patient's Chart, lab work & pertinent test results  Airway Mallampati: I  TM Distance: >3 FB Neck ROM: Full    Dental no notable dental hx. (+) Edentulous Upper, Edentulous Lower   Pulmonary Current Smoker, former smoker,    Pulmonary exam normal breath sounds clear to auscultation       Cardiovascular hypertension, Pt. on medications Normal cardiovascular exam Rhythm:Regular Rate:Normal     Neuro/Psych    GI/Hepatic Neg liver ROS,   Endo/Other  negative endocrine ROS  Renal/GU negative Renal ROS     Musculoskeletal   Abdominal   Peds  Hematology   Anesthesia Other Findings   Reproductive/Obstetrics                            Anesthesia Physical Anesthesia Plan  ASA: 3  Anesthesia Plan: MAC   Post-op Pain Management:    Induction:   PONV Risk Score and Plan: Treatment may vary due to age or medical condition  Airway Management Planned: Natural Airway and Nasal Cannula  Additional Equipment: None  Intra-op Plan:   Post-operative Plan:   Informed Consent: I have reviewed the patients History and Physical, chart, labs and discussed the procedure including the risks, benefits and alternatives for the proposed anesthesia with the patient or authorized representative who has indicated his/her understanding and acceptance.     Dental advisory given  Plan Discussed with: CRNA and Anesthesiologist  Anesthesia Plan Comments: (Screening colonoscopy)       Anesthesia Quick Evaluation

## 2021-12-29 ENCOUNTER — Ambulatory Visit (HOSPITAL_COMMUNITY)
Admission: RE | Admit: 2021-12-29 | Discharge: 2021-12-29 | Disposition: A | Discharge status: Home / Self Care | Payer: Medicare Other | Attending: Gastroenterology | Admitting: Gastroenterology

## 2021-12-29 ENCOUNTER — Ambulatory Visit (HOSPITAL_COMMUNITY): Payer: Medicare Other | Admitting: Anesthesiology

## 2021-12-29 ENCOUNTER — Encounter (HOSPITAL_COMMUNITY): Payer: Self-pay | Admitting: Gastroenterology

## 2021-12-29 ENCOUNTER — Encounter (HOSPITAL_COMMUNITY)
Admission: RE | Disposition: A | Discharge status: Home / Self Care | Payer: Self-pay | Source: Home / Self Care | Attending: Gastroenterology

## 2021-12-29 ENCOUNTER — Ambulatory Visit (HOSPITAL_BASED_OUTPATIENT_CLINIC_OR_DEPARTMENT_OTHER): Payer: Medicare Other | Admitting: Anesthesiology

## 2021-12-29 ENCOUNTER — Other Ambulatory Visit: Payer: Self-pay

## 2021-12-29 DIAGNOSIS — Z87891 Personal history of nicotine dependence: Secondary | ICD-10-CM | POA: Diagnosis not present

## 2021-12-29 DIAGNOSIS — D125 Benign neoplasm of sigmoid colon: Secondary | ICD-10-CM | POA: Insufficient documentation

## 2021-12-29 DIAGNOSIS — I10 Essential (primary) hypertension: Secondary | ICD-10-CM | POA: Diagnosis not present

## 2021-12-29 DIAGNOSIS — R195 Other fecal abnormalities: Secondary | ICD-10-CM | POA: Insufficient documentation

## 2021-12-29 DIAGNOSIS — K5730 Diverticulosis of large intestine without perforation or abscess without bleeding: Secondary | ICD-10-CM | POA: Insufficient documentation

## 2021-12-29 DIAGNOSIS — K635 Polyp of colon: Secondary | ICD-10-CM | POA: Diagnosis not present

## 2021-12-29 DIAGNOSIS — Z1211 Encounter for screening for malignant neoplasm of colon: Secondary | ICD-10-CM | POA: Diagnosis present

## 2021-12-29 HISTORY — PX: POLYPECTOMY: SHX5525

## 2021-12-29 HISTORY — PX: COLONOSCOPY WITH PROPOFOL: SHX5780

## 2021-12-29 SURGERY — COLONOSCOPY WITH PROPOFOL
Anesthesia: Monitor Anesthesia Care

## 2021-12-29 MED ORDER — SODIUM CHLORIDE 0.9 % IV SOLN
INTRAVENOUS | Status: DC
Start: 2021-12-29 — End: 2021-12-29

## 2021-12-29 MED ORDER — PROPOFOL 10 MG/ML IV BOLUS
INTRAVENOUS | Status: DC | PRN
  Administered 2021-12-29: 10 mg via INTRAVENOUS
  Administered 2021-12-29 (×2): 20 mg via INTRAVENOUS

## 2021-12-29 MED ORDER — PHENYLEPHRINE 80 MCG/ML (10ML) SYRINGE FOR IV PUSH (FOR BLOOD PRESSURE SUPPORT)
PREFILLED_SYRINGE | INTRAVENOUS | Status: DC | PRN
  Administered 2021-12-29: 80 ug via INTRAVENOUS
  Administered 2021-12-29: 160 ug via INTRAVENOUS

## 2021-12-29 MED ORDER — EPHEDRINE SULFATE-NACL 50-0.9 MG/10ML-% IV SOSY
PREFILLED_SYRINGE | INTRAVENOUS | Status: DC | PRN
  Administered 2021-12-29: 10 mg via INTRAVENOUS

## 2021-12-29 MED ORDER — PROPOFOL 500 MG/50ML IV EMUL
INTRAVENOUS | Status: DC | PRN
  Administered 2021-12-29: 150 ug/kg/min via INTRAVENOUS

## 2021-12-29 MED ORDER — LACTATED RINGERS IV SOLN: INTRAVENOUS | Status: DC

## 2021-12-29 SURGICAL SUPPLY — 22 items

## 2021-12-29 NOTE — Anesthesia Procedure Notes (Signed)
Procedure Name: MAC Date/Time: 12/29/2021 11:23 AM Performed by: Lieutenant Diego, CRNA Pre-anesthesia Checklist: Patient identified, Emergency Drugs available, Suction available, Patient being monitored and Timeout performed Patient Re-evaluated:Patient Re-evaluated prior to induction Oxygen Delivery Method: Simple face mask Preoxygenation: Pre-oxygenation with 100% oxygen Induction Type: IV induction

## 2021-12-29 NOTE — Op Note (Signed)
[facility] Patient Name: [name] Procedure Date: 12/29/2021 MRN: [id number] Attending MD: Carol Ada , MD Date of Birth: [birth year] CSN: [id number] Age: 76 Admit Type: Outpatient Procedure:                Colonoscopy Indications:              Positive Cologuard test Providers:                Carol Ada, MD, Jeanella Cara, RN,                            William Dalton, Technician Referring MD:              Medicines:                Propofol per Anesthesia Complications:            No immediate complications. Estimated Blood Loss:     Estimated blood loss: none. Procedure:                Pre-Anesthesia Assessment:                           - Prior to the procedure, a History and Physical                            was performed, and patient medications and                            allergies were reviewed. The patient's tolerance of                            previous anesthesia was also reviewed. The risks                            and benefits of the procedure and the sedation                            options and risks were discussed with the patient.                            All questions were answered, and informed consent                            was obtained. Prior Anticoagulants: The patient has                            taken no previous anticoagulant or antiplatelet                            agents. ASA Grade Assessment: III - A patient with                            severe systemic disease. After reviewing the risks                            and benefits,  the patient was deemed in                            satisfactory condition to undergo the procedure.                           - Sedation was administered by an anesthesia                            professional. Deep sedation was attained.                           After obtaining informed consent, the colonoscope                            was passed under direct vision.  Throughout the                            procedure, the patient's blood pressure, pulse, and                            oxygen saturations were monitored continuously. The                            CF-HQ190L ([id number]) Olympus colonoscope was                            introduced through the anus and advanced to the the                            cecum, identified by appendiceal orifice and                            ileocecal valve. The colonoscopy was performed                            without difficulty. The patient tolerated the                            procedure well. The quality of the bowel                            preparation was evaluated using the BBPS [facility]                            Bowel Preparation Scale) with scores of: Right                            Colon = 3 (entire mucosa seen well with no residual                            staining, small fragments of stool or opaque  liquid), Transverse Colon = 3 (entire mucosa seen                            well with no residual staining, small fragments of                            stool or opaque liquid) and Left Colon = 3 (entire                            mucosa seen well with no residual staining, small                            fragments of stool or opaque liquid). The total                            BBPS score equals 9. The quality of the bowel                            preparation was good. The ileocecal valve,                            appendiceal orifice, and rectum were photographed. Scope In: 11:22:09 AM Scope Out: 11:40:50 AM Scope Withdrawal Time: 0 hours 13 minutes 45 seconds  Total Procedure Duration: 0 hours 18 minutes 41 seconds  Findings:      Five sessile polyps were found in the sigmoid colon, ascending colon and       cecum. The polyps were 3 to 5 mm in size. These polyps were removed with       a cold snare. Resection and retrieval were complete.      Scattered small  and large-mouthed diverticula were found in the entire       colon. The majority of the diverticula were in the sigmoid colon. Impression:               - Five 3 to 5 mm polyps in the sigmoid colon, in                            the ascending colon and in the cecum, removed with                            a cold snare. Resected and retrieved.                           - Diverticulosis in the entire examined colon. Moderate Sedation:      Not Applicable - Patient had care per Anesthesia. Recommendation:           - Patient has a contact number available for                            emergencies. The signs and symptoms of potential                            delayed complications were discussed with the  patient. Return to normal activities tomorrow.                            Written discharge instructions were provided to the                            patient.                           - Resume previous diet.                           - Continue present medications.                           - Await pathology results.                           - Repeat colonoscopy in 3 years for surveillance. Procedure Code(s):        --- Professional ---                           [phone], Colonoscopy, flexible; with removal of                            tumor(s), polyp(s), or other lesion(s) by snare                            technique Diagnosis Code(s):        --- Professional ---                           K63.5, Polyp of colon                           R19.5, Other fecal abnormalities                           K57.30, Diverticulosis of large intestine without                            perforation or abscess without bleeding CPT copyright 2019 American Medical Association. All rights reserved. The codes documented in this report are preliminary and upon coder review may  be revised to meet current compliance requirements. Carol Ada, MD Carol Ada, MD 12/29/2021  11:47:02 AM This report has been signed electronically. Number of Addenda: 0

## 2021-12-29 NOTE — Transfer of Care (Signed)
Immediate Anesthesia Transfer of Care Note  Patient: [name]  Procedure(s) Performed: COLONOSCOPY WITH PROPOFOL POLYPECTOMY  Patient Location: Endoscopy Unit  Anesthesia Type:MAC  Level of Consciousness: awake  Airway & Oxygen Therapy: Patient Spontanous Breathing and Patient connected to face mask oxygen  Post-op Assessment: Report given to RN and Post -op Vital signs reviewed and stable  Post vital signs: Reviewed and stable  Last Vitals:  Vitals Value Taken Time  BP 94/48 12/29/21 1146  Temp    Pulse 64 12/29/21 1147  Resp 15 12/29/21 1147  SpO2 97 % 12/29/21 1147  Vitals shown include unvalidated device data.  Last Pain:  Vitals:   12/29/21 1021  TempSrc: Temporal  PainSc: 0-No pain         Complications: No notable events documented.

## 2021-12-29 NOTE — Anesthesia Postprocedure Evaluation (Signed)
Anesthesia Post Note  Patient: [name]  Procedure(s) Performed: COLONOSCOPY WITH PROPOFOL POLYPECTOMY     Patient location during evaluation: Endoscopy Anesthesia Type: MAC Level of consciousness: awake and alert Pain management: pain level controlled Vital Signs Assessment: post-procedure vital signs reviewed and stable Respiratory status: spontaneous breathing, nonlabored ventilation, respiratory function stable and patient connected to nasal cannula oxygen Cardiovascular status: blood pressure returned to baseline and stable Postop Assessment: no apparent nausea or vomiting Anesthetic complications: no   No notable events documented.  Last Vitals:  Vitals:   12/29/21 1215 12/29/21 1220  BP: (!) 104/49 (!) 113/45  Pulse: (!) 58 60  Resp: 14 15  Temp:    SpO2: 96% 99%    Last Pain:  Vitals:   12/29/21 1155  TempSrc:   PainSc: 0-No pain                 Barnet Glasgow

## 2021-12-29 NOTE — H&P (Signed)
[name] HPI: This 76 year old black female presents to the office for colorectal cancer screening. She had a positive stool Cologuard DNA test done in October 2022. She has 3 BM's per week. There is no obvious blood or mucus in the stool. She has a good appetite. She has lost 53 pounds over the past 4 years; she claims the weight loss has been unintentional but seem to occur after a family member moved out and when she was no longer cooking any meals for herself thereafter as she was home alone. She denies having any complaints of abdominal pain, nausea, vomiting, acid reflux, dysphagia or odynophagia. She denies having a family history of colon cancer, celiac sprue or IBD. Her last colonoscopy done on 11/28/07 revealed internal hemorrhoids, extensive sigmoid diverticulosis and a hyperplastic polyp was removed. She had a negative Cologuard stool DNA test done in 2019.   Past Medical History:  Diagnosis Date   History of breast cancer    20 YEARS AGO   Hypertension     Past Surgical History:  Procedure Laterality Date   MASTECTOMY      Family History  Problem Relation Age of Onset   Heart attack Mother    Heart failure Father     Social History:  reports that she quit smoking about 16 months ago. Her smoking use included cigarettes. She has a 8.00 pack-year smoking history. She has never used smokeless tobacco. She reports that she does not currently use alcohol. She reports that she does not currently use drugs.  Allergies:  Allergies  Allergen Reactions   Shellfish Allergy Hives and Shortness Of Breath    Medications: Scheduled: Continuous:  sodium chloride     lactated ringers      No results found for this or any previous visit (from the past 24 hour(s)).   No results found.  ROS:  As stated above in the HPI otherwise negative.  Blood pressure 132/60, pulse 67, temperature 97.9 F (36.6 C), temperature source Temporal, resp. rate 15, height '5\' 3"'$  (1.6 m), weight  79.4 kg, SpO2 93 %.    PE: Gen: NAD, Alert and Oriented HEENT:  [name], EOMI Neck: Supple, no LAD Lungs: CTA Bilaterally CV: RRR without M/G/R ABD: Soft, NTND, +BS Ext: No C/C/E  Assessment/Plan: 1) Positive Cologuard - colonoscopy.  [name] D 12/29/2021, 10:28 AM

## 2021-12-29 NOTE — Discharge Instructions (Signed)

## 2022-01-01 ENCOUNTER — Encounter (HOSPITAL_COMMUNITY): Payer: Self-pay | Admitting: Gastroenterology

## 2022-01-01 LAB — SURGICAL PATHOLOGY

## 2022-07-12 ENCOUNTER — Inpatient Hospital Stay (HOSPITAL_COMMUNITY)
Admission: EM | Admit: 2022-07-12 | Discharge: 2022-07-20 | DRG: 682 | Disposition: A | Discharge status: Skilled Nursing Facility | Payer: Medicare Other | Attending: Internal Medicine | Admitting: Internal Medicine

## 2022-07-12 ENCOUNTER — Inpatient Hospital Stay (HOSPITAL_COMMUNITY): Payer: Medicare Other

## 2022-07-12 ENCOUNTER — Encounter (HOSPITAL_COMMUNITY): Payer: Self-pay | Admitting: Internal Medicine

## 2022-07-12 ENCOUNTER — Other Ambulatory Visit: Payer: Self-pay

## 2022-07-12 DIAGNOSIS — Z8249 Family history of ischemic heart disease and other diseases of the circulatory system: Secondary | ICD-10-CM

## 2022-07-12 DIAGNOSIS — I959 Hypotension, unspecified: Secondary | ICD-10-CM | POA: Diagnosis present

## 2022-07-12 DIAGNOSIS — N179 Acute kidney failure, unspecified: Secondary | ICD-10-CM | POA: Diagnosis not present

## 2022-07-12 DIAGNOSIS — E872 Acidosis, unspecified: Secondary | ICD-10-CM | POA: Diagnosis present

## 2022-07-12 DIAGNOSIS — G9341 Metabolic encephalopathy: Secondary | ICD-10-CM | POA: Diagnosis present

## 2022-07-12 DIAGNOSIS — Z1152 Encounter for screening for COVID-19: Secondary | ICD-10-CM

## 2022-07-12 DIAGNOSIS — Z91013 Allergy to seafood: Secondary | ICD-10-CM | POA: Diagnosis not present

## 2022-07-12 DIAGNOSIS — T68XXXA Hypothermia, initial encounter: Secondary | ICD-10-CM | POA: Diagnosis present

## 2022-07-12 DIAGNOSIS — Z8673 Personal history of transient ischemic attack (TIA), and cerebral infarction without residual deficits: Secondary | ICD-10-CM

## 2022-07-12 DIAGNOSIS — Z5911 Inadequate housing environmental temperature: Secondary | ICD-10-CM

## 2022-07-12 DIAGNOSIS — E669 Obesity, unspecified: Secondary | ICD-10-CM | POA: Diagnosis present

## 2022-07-12 DIAGNOSIS — F17210 Nicotine dependence, cigarettes, uncomplicated: Secondary | ICD-10-CM | POA: Diagnosis present

## 2022-07-12 DIAGNOSIS — R001 Bradycardia, unspecified: Secondary | ICD-10-CM | POA: Diagnosis not present

## 2022-07-12 DIAGNOSIS — Z7982 Long term (current) use of aspirin: Secondary | ICD-10-CM | POA: Diagnosis not present

## 2022-07-12 DIAGNOSIS — E8809 Other disorders of plasma-protein metabolism, not elsewhere classified: Secondary | ICD-10-CM | POA: Diagnosis present

## 2022-07-12 DIAGNOSIS — X31XXXA Exposure to excessive natural cold, initial encounter: Secondary | ICD-10-CM | POA: Diagnosis not present

## 2022-07-12 DIAGNOSIS — Z79899 Other long term (current) drug therapy: Secondary | ICD-10-CM

## 2022-07-12 DIAGNOSIS — R443 Hallucinations, unspecified: Secondary | ICD-10-CM | POA: Diagnosis present

## 2022-07-12 DIAGNOSIS — F0152 Vascular dementia, unspecified severity, with psychotic disturbance: Secondary | ICD-10-CM | POA: Diagnosis present

## 2022-07-12 DIAGNOSIS — Z853 Personal history of malignant neoplasm of breast: Secondary | ICD-10-CM

## 2022-07-12 DIAGNOSIS — I10 Essential (primary) hypertension: Secondary | ICD-10-CM | POA: Diagnosis present

## 2022-07-12 DIAGNOSIS — D649 Anemia, unspecified: Secondary | ICD-10-CM | POA: Diagnosis present

## 2022-07-12 DIAGNOSIS — Z6831 Body mass index (BMI) 31.0-31.9, adult: Secondary | ICD-10-CM

## 2022-07-12 DIAGNOSIS — T68XXXD Hypothermia, subsequent encounter: Secondary | ICD-10-CM | POA: Diagnosis not present

## 2022-07-12 DIAGNOSIS — R4689 Other symptoms and signs involving appearance and behavior: Secondary | ICD-10-CM | POA: Diagnosis not present

## 2022-07-12 DIAGNOSIS — R410 Disorientation, unspecified: Secondary | ICD-10-CM | POA: Diagnosis not present

## 2022-07-12 LAB — CBC WITH DIFFERENTIAL/PLATELET
Abs Immature Granulocytes: 0.01 10*3/uL (ref 0.00–0.07)
Basophils Absolute: 0 10*3/uL (ref 0.0–0.1)
Basophils Relative: 1 %
Eosinophils Absolute: 0 10*3/uL (ref 0.0–0.5)
Eosinophils Relative: 0 %
HCT: 36.1 % (ref 36.0–46.0)
Hemoglobin: 11.6 g/dL — ABNORMAL LOW (ref 12.0–15.0)
Immature Granulocytes: 0 %
Lymphocytes Relative: 28 %
Lymphs Abs: 1.1 10*3/uL (ref 0.7–4.0)
MCH: 26.9 pg (ref 26.0–34.0)
MCHC: 32.1 g/dL (ref 30.0–36.0)
MCV: 83.6 fL (ref 80.0–100.0)
Monocytes Absolute: 0.5 10*3/uL (ref 0.1–1.0)
Monocytes Relative: 13 %
Neutro Abs: 2.2 10*3/uL (ref 1.7–7.7)
Neutrophils Relative %: 58 %
Platelets: 220 10*3/uL (ref 150–400)
RBC: 4.32 MIL/uL (ref 3.87–5.11)
RDW: 13.7 % (ref 11.5–15.5)
WBC: 3.9 10*3/uL — ABNORMAL LOW (ref 4.0–10.5)
nRBC: 0 % (ref 0.0–0.2)

## 2022-07-12 LAB — RESPIRATORY PANEL BY PCR

## 2022-07-12 LAB — RAPID URINE DRUG SCREEN, HOSP PERFORMED
Amphetamines: NOT DETECTED
Barbiturates: NOT DETECTED
Benzodiazepines: NOT DETECTED
Cocaine: NOT DETECTED
Opiates: NOT DETECTED
Tetrahydrocannabinol: NOT DETECTED

## 2022-07-12 LAB — URINALYSIS, ROUTINE W REFLEX MICROSCOPIC
Bilirubin Urine: NEGATIVE
Glucose, UA: NEGATIVE mg/dL
Hgb urine dipstick: NEGATIVE
Ketones, ur: 5 mg/dL — AB
Leukocytes,Ua: NEGATIVE
Nitrite: NEGATIVE
Protein, ur: 30 mg/dL — AB
Specific Gravity, Urine: 1.023 (ref 1.005–1.030)
pH: 5 (ref 5.0–8.0)

## 2022-07-12 LAB — COMPREHENSIVE METABOLIC PANEL
ALT: 18 U/L (ref 0–44)
AST: 32 U/L (ref 15–41)
Albumin: 3.7 g/dL (ref 3.5–5.0)
Alkaline Phosphatase: 83 U/L (ref 38–126)
Anion gap: 10 (ref 5–15)
BUN: 23 mg/dL (ref 8–23)
CO2: 25 mmol/L (ref 22–32)
Calcium: 9.3 mg/dL (ref 8.9–10.3)
Chloride: 103 mmol/L (ref 98–111)
Creatinine, Ser: 1.4 mg/dL — ABNORMAL HIGH (ref 0.44–1.00)
GFR, Estimated: 39 mL/min — ABNORMAL LOW (ref >60)
Glucose, Bld: 98 mg/dL (ref 70–99)
Potassium: 4 mmol/L (ref 3.5–5.1)
Sodium: 138 mmol/L (ref 135–145)
Total Bilirubin: 0.7 mg/dL (ref 0.3–1.2)
Total Protein: 7.3 g/dL (ref 6.5–8.1)

## 2022-07-12 LAB — VITAMIN B12: Vitamin B-12: 174 pg/mL — ABNORMAL LOW (ref 180–914)

## 2022-07-12 LAB — SARS CORONAVIRUS 2 BY RT PCR: SARS Coronavirus 2 by RT PCR: NEGATIVE

## 2022-07-12 LAB — TSH: TSH: 2.034 u[IU]/mL (ref 0.350–4.500)

## 2022-07-12 LAB — CBG MONITORING, ED: Glucose-Capillary: 105 mg/dL — ABNORMAL HIGH (ref 70–99)

## 2022-07-12 MED ORDER — SODIUM CHLORIDE 0.9 % IV BOLUS
1000.0000 mL | Freq: Once | INTRAVENOUS | Status: AC
  Administered 2022-07-12: 1000 mL via INTRAVENOUS

## 2022-07-12 MED ORDER — ACETAMINOPHEN 650 MG RE SUPP: 650.0000 mg | Freq: Four times a day (QID) | RECTAL | Status: DC | PRN

## 2022-07-12 MED ORDER — ONDANSETRON HCL 4 MG PO TABS: 4.0000 mg | ORAL_TABLET | Freq: Four times a day (QID) | ORAL | Status: DC | PRN

## 2022-07-12 MED ORDER — SODIUM CHLORIDE 0.9 % IV SOLN: INTRAVENOUS | Status: DC

## 2022-07-12 MED ORDER — ONDANSETRON HCL 4 MG/2ML IJ SOLN: 4.0000 mg | Freq: Four times a day (QID) | INTRAMUSCULAR | Status: DC | PRN

## 2022-07-12 MED ORDER — ACETAMINOPHEN 325 MG PO TABS: 650.0000 mg | ORAL_TABLET | Freq: Four times a day (QID) | ORAL | Status: DC | PRN

## 2022-07-12 MED ORDER — LACTATED RINGERS IV BOLUS
1000.0000 mL | Freq: Once | INTRAVENOUS | Status: AC
  Administered 2022-07-12: 1000 mL via INTRAVENOUS

## 2022-07-12 NOTE — ED Triage Notes (Addendum)
Patient is from home A&Ox3 Patient has no heat at home and is taking care of herself. Son comes by to check on her but he also has health problems. Neighbors think patient is more confused then usual.

## 2022-07-12 NOTE — ED Notes (Signed)
Family updated doug Peacock [phone]

## 2022-07-12 NOTE — ED Provider Notes (Signed)
[name] DEPT Provider Note   CSN: [id number] Arrival date & time: 07/12/22  0009     History  Chief Complaint  Patient presents with   Cold Exposure    alt   Altered Mental Status    [name] is a 76 y.o. female.  HPI     This is a 76 year old female brought in from home.  She is alert and oriented x3.  She provides history.  She lives by herself.  She does not have any heat in the home.  Unclear who called EMS.  She has a son who checks on her.  Neighbors felt she was more confused per EMS.  Patient does not have any complaints.  She denies chest pain, shortness of breath, recent fevers.  Home Medications Prior to Admission medications   Medication Sig Start Date End Date Taking? Authorizing Provider  aspirin EC 81 MG tablet Take 81 mg by mouth daily. Swallow whole.    [provider]  cholecalciferol (VITAMIN D3) 25 MCG (1000 UNIT) tablet Take 1,000 Units by mouth daily.    [provider]  clotrimazole (LOTRIMIN) 1 % cream Apply 1 application. topically 2 (two) times daily as needed (irritation).    [provider]  losartan (COZAAR) 50 MG tablet Take 50 mg by mouth daily. 11/23/21   [provider]      Allergies    Shellfish allergy    Review of Systems   Review of Systems  Constitutional:  Negative for fever.  Respiratory:  Negative for shortness of breath.   Cardiovascular:  Negative for chest pain.  Gastrointestinal:  Negative for abdominal pain, nausea and vomiting.  All other systems reviewed and are negative.   Physical Exam Updated Vital Signs BP (!) 115/56   Pulse 68   Temp (!) 93.7 F (34.3 C) (Oral)   Resp 11   SpO2 98%  Physical Exam Vitals and nursing note reviewed.  Constitutional:      Appearance: She is well-developed.  HENT:     Head: Normocephalic and atraumatic.     Mouth/Throat:     Mouth: Mucous membranes are dry.  Eyes:     Pupils: Pupils are equal,  round, and reactive to light.  Cardiovascular:     Rate and Rhythm: Normal rate and regular rhythm.     Heart sounds: Normal heart sounds.  Pulmonary:     Effort: Pulmonary effort is normal. No respiratory distress.     Breath sounds: No wheezing.  Abdominal:     Palpations: Abdomen is soft.     Tenderness: There is no abdominal tenderness.  Musculoskeletal:     Cervical back: Neck supple.  Skin:    General: Skin is warm and dry.  Neurological:     Mental Status: She is alert and oriented to person, place, and time.  Psychiatric:        Mood and Affect: Mood normal.     ED Results / Procedures / Treatments   Labs (all labs ordered are listed, but only abnormal results are displayed) Labs Reviewed  CBC WITH DIFFERENTIAL/PLATELET - Abnormal; Notable for the following components:      Result Value   WBC 3.9 (*)    Hemoglobin 11.6 (*)    All other components within normal limits  COMPREHENSIVE METABOLIC PANEL - Abnormal; Notable for the following components:   Creatinine, Ser 1.40 (*)    GFR, Estimated 39 (*)    All other components within  normal limits  URINALYSIS, ROUTINE W REFLEX MICROSCOPIC - Abnormal; Notable for the following components:   Color, Urine AMBER (*)    APPearance HAZY (*)    Ketones, ur 5 (*)    Protein, ur 30 (*)    Bacteria, UA RARE (*)    All other components within normal limits  CBG MONITORING, ED - Abnormal; Notable for the following components:   Glucose-Capillary 105 (*)    All other components within normal limits  TSH    EKG EKG Interpretation  Date/Time:  Thursday July 12 2022 01:00:29 EST Ventricular Rate:  57 PR Interval:  55 QRS Duration: 106 QT Interval:  451 QTC Calculation: 440 R Axis:   42 Text Interpretation: Sinus rhythm Short PR interval LAE, consider biatrial enlargement Abnormal R-wave progression, early transition Borderline T abnormalities, inferior leads Artifact in lead(s) I II III aVR aVL aVF V1 V2 V5 Confirmed by  Thayer Jew [phone]) on 07/12/2022 2:50:15 AM  Radiology No results found.  Procedures Procedures    Medications Ordered in ED Medications  sodium chloride 0.9 % bolus 1,000 mL (1,000 mLs Intravenous New Bag/Given 07/12/22 0140)    ED Course/ Medical Decision Making/ A&P                           Medical Decision Making Amount and/or Complexity of Data Reviewed Labs: ordered.  Risk Decision regarding hospitalization.   This patient presents to the ED for concern of altered mental status, this involves an extensive number of treatment options, and is a complaint that carries with it a high risk of complications and morbidity.  I considered the following differential and admission for this acute, potentially life threatening condition.  The differential diagnosis includes acute infection, metabolic derangements, hyperthermia  MDM:    This is a 76 year old female who apparently lives by herself.  EMS noted that she did not have any heat in her house.  Is unclear who called EMS but EMS reported that her neighbors were concerned that she was altered.  On my evaluation she is oriented x3.  She has no acute complaints.  She was noted to be hypothermic with a rectal temperature of 90.  She was placed under [facility].  I gave her some fluids.  Labs obtained.  Very slight leukopenia at 3.9.  Otherwise no significant metabolic derangements.  She does have slight AKI with a creatinine of 1.4.  Baseline 0.8.  She was given a liter of fluids.  No evidence of UTI.  Have low suspicion for infectious etiology and she does not appear overtly septic.  She is not hypotensive.  Suspect that her hypothermia is related to environmental factors.  Given this and her AKI, will plan for admission for hydration and social work evaluation  (Labs, imaging, consults)  Labs: I Ordered, and personally interpreted labs.  The pertinent results include: CBC, CMP, urinalysis  Imaging Studies ordered: I ordered  imaging studies including none I independently visualized and interpreted imaging. I agree with the radiologist interpretation  Additional history obtained from chart review.  External records from outside source obtained and reviewed including evaluations  Cardiac Monitoring: The patient was maintained on a cardiac monitor.  I personally viewed and interpreted the cardiac monitored which showed an underlying rhythm of: Sinus rhythm  Reevaluation: After the interventions noted above, I reevaluated the patient and found that they have :improved  Social Determinants of Health:  lives independently, unclear social situation  Disposition: Admit  Co morbidities that complicate the patient evaluation  Past Medical History:  Diagnosis Date   History of breast cancer    20 YEARS AGO   Hypertension      Medicines Meds ordered this encounter  Medications   sodium chloride 0.9 % bolus 1,000 mL    I have reviewed the patients home medicines and have made adjustments as needed  Problem List / ED Course: Problem List Items Addressed This Visit       Other   Hypothermia - Primary   Other Visit Diagnoses     AKI (acute kidney injury) ([place])                       Final Clinical Impression(s) / ED Diagnoses Final diagnoses:  Hypothermia, initial encounter  AKI (acute kidney injury) ([place])    Rx / DC Orders ED Discharge Orders     None         Dina Rich, Barbette Hair, MD 07/12/22 [id number]

## 2022-07-12 NOTE — ED Notes (Signed)
Bear hugger applied 

## 2022-07-12 NOTE — ED Notes (Signed)
Bear hugger removed from patient

## 2022-07-12 NOTE — H&P (Signed)
History and Physical    Patient: [name] DOB: [birth year] DOA: 07/12/2022 DOS: the patient was seen and examined on 07/12/2022 PCP: Velna Hatchet, MD  Patient coming from: Home  Chief Complaint:  Chief Complaint  Patient presents with   Cold Exposure    alt   Altered Mental Status   HPI: [name] is a 75 y.o. female with medical history significant of hypertension, breast cancer, unspecified class obesity who was brought to the emergency department via EMS after the patient was confused.  She lives by herself.  She has a son who lives in the area who checks on her.  Neighbors felt that she was confused, but it is unclear who called EMS.  She was found to be hypothermic due to cold exposure.  She stated she went outside only for 30 minutes and was well dressed.  She also stated she is only going to stay because his son asked her to.  She is still disoriented to time, date and situation.  She denied headache, abdominal, back or chest pain at this point.,  No cough or wheezing according to the patient. She stated she still smokes several cigarettes a day.  As stated she has been eating.  No diarrhea or constipation.  Denied urinary symptoms.  ED course: Initial vital signs were temperature 90.9 F, pulse 55, respirations 17, BP 137/82 mmHg O2 sat 100% on room air.  The patient received warming measures and 1000 mL old normal saline bolus.  I added LR 1000 mL bolus.  Lab work: Her urinalysis was hazy with ketonuria 5 and proteinuria 30 mg deciliter with rare bacteria microscopic examination.  CBC showed a white count 3.9, hemoglobin 11.6 g/dL platelets 220.  CMP with a creatinine of 1.40 mg/deciliter, but otherwise normal.  Her previous creatinine levels have being under 1.   Review of Systems: As mentioned in the history of present illness. All other systems reviewed and are negative. Past Medical History:  Diagnosis Date   History of breast cancer    20 YEARS AGO    Hypertension    Past Surgical History:  Procedure Laterality Date   COLONOSCOPY WITH PROPOFOL N/A 12/29/2021   Procedure: COLONOSCOPY WITH PROPOFOL;  Surgeon: Carol Ada, MD;  Location: [facility];  Service: Gastroenterology;  Laterality: N/A;   MASTECTOMY     POLYPECTOMY  12/29/2021   Procedure: POLYPECTOMY;  Surgeon: Carol Ada, MD;  Location: [facility];  Service: Gastroenterology;;   Social History:  reports that she quit smoking about 22 months ago. Her smoking use included cigarettes. She has a 8.00 pack-year smoking history. She has never used smokeless tobacco. She reports that she does not currently use alcohol. She reports that she does not currently use drugs.  Allergies  Allergen Reactions   Shellfish Allergy Hives and Shortness Of Breath    Family History  Problem Relation Age of Onset   Heart attack Mother    Heart failure Father     Prior to Admission medications   Medication Sig Start Date End Date Taking? Authorizing Provider  aspirin EC 81 MG tablet Take 81 mg by mouth daily. Swallow whole.    [provider]  cholecalciferol (VITAMIN D3) 25 MCG (1000 UNIT) tablet Take 1,000 Units by mouth daily.    [provider]  clotrimazole (LOTRIMIN) 1 % cream Apply 1 application. topically 2 (two) times daily as needed (irritation).    [provider]  losartan (COZAAR) 50 MG tablet Take 50  mg by mouth daily. 11/23/21   [provider]    Physical Exam: Vitals:   07/12/22 0659 07/12/22 0700 07/12/22 0715 07/12/22 0730  BP:  (!) 77/33 (!) 84/51 (!) 96/48  Pulse:  79 84 78  Resp:  '13 16 14  '$ Temp: 97.9 F (36.6 C)  98.5 F (36.9 C)   TempSrc: Rectal  Oral   SpO2:  97% 97% 92%   Physical Exam Vitals and nursing note reviewed.  Constitutional:      General: She is awake.     Appearance: Normal appearance.  HENT:     Head: Normocephalic.     Nose: No rhinorrhea.     Mouth/Throat:     Mouth: Mucous membranes are moist.   Eyes:     General: No scleral icterus.    Pupils: Pupils are equal, round, and reactive to light.  Neck:     Vascular: No JVD.  Cardiovascular:     Rate and Rhythm: Normal rate and regular rhythm.     Heart sounds: S1 normal and S2 normal.  Pulmonary:     Effort: Pulmonary effort is normal. No accessory muscle usage.     Breath sounds: Wheezing present. No rhonchi or rales.  Abdominal:     General: Bowel sounds are normal. There is no distension.     Palpations: Abdomen is soft.     Tenderness: There is no abdominal tenderness. There is no guarding.  Musculoskeletal:     Cervical back: Neck supple.     Right lower leg: No edema.     Left lower leg: No edema.  Skin:    General: Skin is warm and dry.  Neurological:     General: No focal deficit present.     Mental Status: She is alert. She is disoriented.  Psychiatric:        Mood and Affect: Mood normal.        Behavior: Behavior normal. Behavior is cooperative.   Data Reviewed:  Results are pending, will review when available.  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 Multiple strokes on previous CT head and active smoking. She stated she has smoked since her 64s. Inpatient/PCU.Marland Kitchen TSH was normal. Previously low normal B12 level (09/13/2020). Recheck B12 level. Obtain CT head. Reorient as needed. Consult behavioral health. Consult transitional care team.  Active Problems:   Hypothermia Resolved.    Sinus bradycardia Secondary to hypothermia. Already resolved.    AKI (acute kidney injury) ([place])  Continue IV fluids. Hold ARB/ACE. Avoid hypotension. Avoid nephrotoxins. Monitor intake and output. Monitor renal function electrolytes.    Essential hypertension Hold losartan for now. Monitor blood pressure.    Normocytic anemia Check anemia panel. Monitor hematocrit and hemoglobin.     Advance Care Planning:   Code Status: Full  code.  Consults:   Family Communication:   Severity of Illness: The appropriate patient status for this patient is INPATIENT. Inpatient status is judged to be reasonable and necessary in order to provide the required intensity of service to ensure the patient's safety. The patient's presenting symptoms, physical exam findings, and initial radiographic and laboratory data in the context of their chronic comorbidities is felt to place them at high risk for further clinical deterioration. Furthermore, it is not anticipated that the patient will be medically stable for discharge from the hospital within 2 midnights of admission.   * I certify that at the point of admission it is my clinical  judgment that the patient will require inpatient hospital care spanning beyond 2 midnights from the point of admission due to high intensity of service, high risk for further deterioration and high frequency of surveillance required.*  Author: Reubin Milan, MD 07/12/2022 7:50 AM  For on call review [email or web address].   This document was prepared using Dragon voice recognition software and may contain some unintended transcription errors.

## 2022-07-13 DIAGNOSIS — T68XXXD Hypothermia, subsequent encounter: Secondary | ICD-10-CM

## 2022-07-13 DIAGNOSIS — D649 Anemia, unspecified: Secondary | ICD-10-CM | POA: Diagnosis not present

## 2022-07-13 DIAGNOSIS — N179 Acute kidney failure, unspecified: Secondary | ICD-10-CM | POA: Diagnosis not present

## 2022-07-13 DIAGNOSIS — R001 Bradycardia, unspecified: Secondary | ICD-10-CM

## 2022-07-13 DIAGNOSIS — G9341 Metabolic encephalopathy: Secondary | ICD-10-CM | POA: Diagnosis not present

## 2022-07-13 LAB — CBC
HCT: 32.4 % — ABNORMAL LOW (ref 36.0–46.0)
Hemoglobin: 10.4 g/dL — ABNORMAL LOW (ref 12.0–15.0)
MCH: 26.9 pg (ref 26.0–34.0)
MCHC: 32.1 g/dL (ref 30.0–36.0)
MCV: 83.7 fL (ref 80.0–100.0)
Platelets: 205 10*3/uL (ref 150–400)
RBC: 3.87 MIL/uL (ref 3.87–5.11)
RDW: 13.9 % (ref 11.5–15.5)
WBC: 4.6 10*3/uL (ref 4.0–10.5)
nRBC: 0 % (ref 0.0–0.2)

## 2022-07-13 LAB — COMPREHENSIVE METABOLIC PANEL
ALT: 15 U/L (ref 0–44)
AST: 32 U/L (ref 15–41)
Albumin: 3.1 g/dL — ABNORMAL LOW (ref 3.5–5.0)
Alkaline Phosphatase: 67 U/L (ref 38–126)
Anion gap: 9 (ref 5–15)
BUN: 25 mg/dL — ABNORMAL HIGH (ref 8–23)
CO2: 23 mmol/L (ref 22–32)
Calcium: 8.7 mg/dL — ABNORMAL LOW (ref 8.9–10.3)
Chloride: 110 mmol/L (ref 98–111)
Creatinine, Ser: 0.98 mg/dL (ref 0.44–1.00)
GFR, Estimated: >60 mL/min (ref >60)
Glucose, Bld: 71 mg/dL (ref 70–99)
Potassium: 3.5 mmol/L (ref 3.5–5.1)
Sodium: 142 mmol/L (ref 135–145)
Total Bilirubin: 0.7 mg/dL (ref 0.3–1.2)
Total Protein: 6.2 g/dL — ABNORMAL LOW (ref 6.5–8.1)

## 2022-07-13 MED ORDER — HALOPERIDOL LACTATE 5 MG/ML IJ SOLN
2.0000 mg | Freq: Four times a day (QID) | INTRAMUSCULAR | Status: DC | PRN
  Administered 2022-07-13 – 2022-07-17 (×3): 2 mg via INTRAVENOUS
  Filled 2022-07-13 (×3): qty 1

## 2022-07-13 MED ORDER — OLANZAPINE 10 MG IM SOLR
2.5000 mg | Freq: Once | INTRAMUSCULAR | Status: AC
  Administered 2022-07-13: 2.5 mg via INTRAMUSCULAR
  Filled 2022-07-13: qty 10

## 2022-07-13 NOTE — Discharge Instructions (Signed)
Patient's son will call resource agencies and make appointments.

## 2022-07-13 NOTE — Progress Notes (Signed)
Mobility Specialist - Progress Note   07/13/22 1356  Mobility  Activity Ambulated with assistance in hallway  Level of Assistance Standby assist, set-up cues, supervision of patient - no hands on  Assistive Device Front wheel walker  Distance Ambulated (ft) 350 ft  Range of Motion/Exercises Active  Activity Response Tolerated well  Mobility Referral Yes  $Mobility charge 1 Mobility   Pt was found on recliner chair and agreeable to ambulate. Had no complaints during session and at EOS returned to recliner chair with necessities in reach.  Ferd Hibbs Mobility Specialist

## 2022-07-13 NOTE — TOC Initial Note (Signed)
Transition of Care [facility]) - Initial/Assessment Note    Patient Details  Name: [name] MRN: [id number] Date of Birth: 06/29/1946  [facility] CM/SW Contact:    Henrietta Dine, RN Phone Number: 07/13/2022, 1:59 PM  Clinical Narrative:                 [facility] consult for d/c needs; pt pleasantly confused; contacted pt's son Shenay Torti ([phone]); he says the pt lives alone and plans for her to return at d/c; he says the pt has transportation home; Mr Fenn says the pt wears reading glasses and does not wear hearing aides; he says she wears upper and lower dentures; Mr Stang also says the pt has a cane, BSC, shower chair; he also says there is a bar in her shower; he also says the pt does not have [place] services; Mr Shelvin says the pt has a card from [facility] that is used for food and meds; he is concerned about the pt's home not having heat; he says the pt's gas is turned off because the bill is too high for her income; he would like information about utility assistance for Seniors; pt's son given contact information for [employer] and [employer] for possible one time assist with utilities, and low income energy assistance program; he will contact the agencies and make any required appts; also notified Mr Mannan resources for this will be placed in her d/c instructions, and copy in pt's room on window seat; he verbalized understanding; TOC will con't to follow.  Expected Discharge Plan: Home/Self Care Barriers to Discharge: Continued Medical Work up   Patient Goals and CMS Choice        Expected Discharge Plan and Services Expected Discharge Plan: Home/Self Care   Discharge Planning Services: CM Consult   Living arrangements for the past 2 months: Single Family Home                                      Prior Living Arrangements/Services Living arrangements for the past 2 months: Single Family Home Lives with:: Self Patient language and need for  interpreter reviewed:: Yes Do you feel safe going back to the place where you live?: Yes      Need for Family Participation in Patient Care: Yes (Comment) Care giver support system in place?: Yes (comment)   Criminal Activity/Legal Involvement Pertinent to Current Situation/Hospitalization: No - Comment as needed  Activities of Daily Living Home Assistive Devices/Equipment: None ADL Screening (condition at time of admission) Patient's cognitive ability adequate to safely complete daily activities?: Yes Is the patient deaf or have difficulty hearing?: No Does the patient have difficulty seeing, even when wearing glasses/contacts?: No Does the patient have difficulty concentrating, remembering, or making decisions?: No Patient able to express need for assistance with ADLs?: Yes Does the patient have difficulty dressing or bathing?: No Independently performs ADLs?: Yes (appropriate for developmental age) Does the patient have difficulty walking or climbing stairs?: No Weakness of Legs: None Weakness of Arms/Hands: None  Permission Sought/Granted Permission sought to share information with : Case Manager Permission granted to share information with : Yes, Verbal Permission Granted              Emotional Assessment Appearance:: Appears stated age Attitude/Demeanor/Rapport: Gracious Affect (typically observed): Accepting Orientation: : Oriented to Self, Oriented to Place Alcohol / Substance Use: Not Applicable Psych Involvement:  No (comment)  Admission diagnosis:  AKI (acute kidney injury) ([place]) [N17.9] Hypothermia, initial encounter [T68.XXXA] Acute metabolic encephalopathy [V37.10] Patient Active Problem List   Diagnosis Date Noted   Acute metabolic encephalopathy 62/69/4854   Normocytic anemia 07/12/2022   Obesity 07/12/2022   AKI (acute kidney injury) ([place]) 07/12/2022   Hypothermia 09/13/2020   Sinus bradycardia 09/13/2020   Essential hypertension 09/13/2020   PCP:   Velna Hatchet, MD Pharmacy:   [id number] [place], [id number]: [phone]: [phone]    Social Determinants of Health (SDOH) Interventions Housing Interventions: Inpatient TOC (Pt son reports pt cannot afford her gas, and this time of year, her hypothermia is always a case when temperature drops because of this. Inquired after assistance.)  Readmission Risk Interventions     No data to display

## 2022-07-13 NOTE — Progress Notes (Signed)
Patient confused, threatening to assault staff and attempting to leave the room. Haldol '2mg'$  administered IVSP for agitation.

## 2022-07-13 NOTE — Progress Notes (Signed)
PROGRESS NOTE    [name]  GBE:[id number] DOB: Oct 08, 1945 DOA: 07/12/2022 PCP: Velna Hatchet, MD   Brief Narrative:  HPI per Dr. Tennis Must [name] is a 76 y.o. female with medical history significant of hypertension, breast cancer, unspecified class obesity who was brought to the emergency department via EMS after the patient was confused.  She lives by herself.  She has a son who lives in the area who checks on her.  Neighbors felt that she was confused, but it is unclear who called EMS.  She was found to be hypothermic due to cold exposure.  She stated she went outside only for 30 minutes and was well dressed.  She also stated she is only going to stay because his son asked her to.  She is still disoriented to time, date and situation.  She denied headache, abdominal, back or chest pain at this point.,  No cough or wheezing according to the patient. She stated she still smokes several cigarettes a day.  As stated she has been eating.  No diarrhea or constipation.  Denied urinary symptoms.   ED course: Initial vital signs were temperature 90.9 F, pulse 55, respirations 17, BP 137/82 mmHg O2 sat 100% on room air.  The patient received warming measures and 1000 mL old normal saline bolus.  I added LR 1000 mL bolus.   Lab work: Her urinalysis was hazy with ketonuria 5 and proteinuria 30 mg deciliter with rare bacteria microscopic examination.  CBC showed a white count 3.9, hemoglobin 11.6 g/dL platelets 220.  CMP with a creatinine of 1.40 mg/deciliter, but otherwise normal.  Her previous creatinine levels have being under 1.  **Interim History He is likely presented with environmental hypothermia we are ruling out infection.  She is improving and COVID testing is negative.  UDS is negative as well.  She is afebrile and not tachycardic.  PT OT are pending evaluation  Assessment and Plan:  Acute metabolic encephalopathy, improving -Unclear Etiology but Suspect superimposed vascular  dementia. -Multiple strokes on previous CT head and active smoking. -She stated she has smoked since her 57s. -Inpatient/PCU admission. -TSH was normal. Previously low normal B12 level (09/13/2020) and Recheck B12 level still low at 174 -Will start B12 Supplementation -Urinalysis done and showed a hazy appearance with amber-colored urine, negative glucose, negative hemoglobin, negative nitrites, negative leukocytes, rare bacteria, 0-5 squamous epithelial cells, 0-5 RBCs per high-power field, and 0-5 WBCs -Obtained CT head and showed "Atrophy and chronic small vessel ischemic changes. Chronic bilateral CVAs. Previous aneurysm repair. No acute intracranial process identified." -Respiratory virus panel via PCR is negative -Reorient as needed. -Consulted transitional care team. Patient lives at home and there is living concerns due to lack of heat -PT/OT to evaluate and Treat    Hypothermia -Resolved. -Infection less likely as she is afebrile and has no leukocytosis and has no localizing symptoms   Sinus bradycardia -Secondary to hypothermia. -Already resolved. -TSH was 2.024 -Continue monitor on telemetry   AKI (acute kidney injury) ([place])  -Continued IV fluids. -Hold ARB/ACE. -BUN/creatinine has improved and gone from 23/1.40 is now 25/0.98 -Avoid further nephrotoxic medications, contrast dyes, hypotension and dehydration to ensure adequate renal perfusion will need to renally dose medications -Will need to monitor strict input and output and continue monitor renal function carefully and trend electrolytes and repeat CMP in the a.m.   Essential hypertension -Hold losartan for now given her hypotension on admission. -Continue to monitor blood pressures per protocol -Last  blood pressure reading was 138/68 but got as low as 77/33   Normocytic Anemia -Patient's hemoglobin/hematocrit has gone from 11.6/36.1 and is now 10.4/32.4 -Check anemia panel in a.m. -Continue to monitor for signs  and symptoms of bleeding; no overt bleeding noted -Repeat CBC in a.m.   Hypoalbuminemia -Mild. The patient's albumin level went from 3.7 is now 3.1 -Continue to monitor and trend and repeat CMP in a.m.  Obesity -Complicates overall prognosis and care -Estimated body mass index is 31 kg/m as calculated from the following:   Height as of 12/29/21: '5\' 3"'$  (1.6 m).   Weight as of 12/29/21: 79.4 kg.  -Weight Loss and Dietary Counseling given  DVT prophylaxis: SCDs Start: 07/12/22 0803    Code Status: Full Code Family Communication: No family Currently at bedside  Disposition Plan:  Level of care: Progressive Status is: Inpatient Remains inpatient appropriate because: Needs further evaluation by PT and OT and further clinical improvement.   Consultants:  None  Procedures:  Abdomen needed as above  Antimicrobials:  Anti-infectives (From admission, onward)    None       Subjective: Seen and examined at bedside and still is a little confused but was improved.  Denies any chest pain or shortness of breath.  No nausea or vomiting.  Denies any symptoms and denies any pain anywhere.  Objective: Vitals:   07/12/22 2036 07/13/22 0016 07/13/22 0629 07/13/22 1217  BP: 138/64 125/72 130/85 138/68  Pulse: 72 61 68 64  Resp: '18 18 18 16  '$ Temp: 98 F (21.1 C) 98.1 F (36.7 C) 97.6 F (36.4 C) 98 F (36.7 C)  TempSrc: Oral Oral Oral Oral  SpO2: 99% 99% 98% 100%    Intake/Output Summary (Last 24 hours) at 07/13/2022 1559 Last data filed at 07/13/2022 0606 Gross per 24 hour  Intake 1974.54 ml  Output 250 ml  Net 1724.54 ml   There were no vitals filed for this visit.  Examination: Physical Exam:  Constitutional: WN/WD obese African-American female currently no acute distress Respiratory: Diminished to auscultation bilaterally with coarse breath sounds, no wheezing, rales, rhonchi or crackles. Normal respiratory effort and patient is not tachypenic. No accessory muscle use.   Unlabored breathing Cardiovascular: RRR, no murmurs / rubs / gallops. S1 and S2 auscultated. No extremity edema.  Abdomen: Soft, non-tender, distended secondary body habitus. Bowel sounds positive.  GU: Deferred. Musculoskeletal: No clubbing / cyanosis of digits/nails. No joint deformity upper and lower extremities.  Skin: No rashes, lesions, ulcers on limited skin evaluation. No induration; Warm and dry.  Neurologic: CN 2-12 grossly intact with no focal deficits.  Romberg sign cerebellar reflexes not assessed.  Psychiatric: Normal judgment and insight. Alert and oriented x 2  Data Reviewed: I have personally reviewed following labs and imaging studies  CBC: Recent Labs  Lab 07/12/22 0115 07/13/22 0413  WBC 3.9* 4.6  NEUTROABS 2.2  --   HGB 11.6* 10.4*  HCT 36.1 32.4*  MCV 83.6 83.7  PLT 220 941   Basic Metabolic Panel: Recent Labs  Lab 07/12/22 0115 07/13/22 0413  NA 138 142  K 4.0 3.5  CL 103 110  CO2 25 23  GLUCOSE 98 71  BUN 23 25*  CREATININE 1.40* 0.98  CALCIUM 9.3 8.7*   GFR: CrCl cannot be calculated (Unknown ideal weight.). Liver Function Tests: Recent Labs  Lab 07/12/22 0115 07/13/22 0413  AST 32 32  ALT 18 15  ALKPHOS 83 67  BILITOT 0.7 0.7  PROT 7.3 6.2*  ALBUMIN 3.7 3.1*   No results for input(s): "LIPASE", "AMYLASE" in the last 168 hours. No results for input(s): "AMMONIA" in the last 168 hours. Coagulation Profile: No results for input(s): "INR", "PROTIME" in the last 168 hours. Cardiac Enzymes: No results for input(s): "CKTOTAL", "CKMB", "CKMBINDEX", "TROPONINI" in the last 168 hours. BNP (last 3 results) No results for input(s): "PROBNP" in the last 8760 hours. HbA1C: No results for input(s): "HGBA1C" in the last 72 hours. CBG: Recent Labs  Lab 07/12/22 0046  GLUCAP 105*   Lipid Profile: No results for input(s): "CHOL", "HDL", "LDLCALC", "TRIG", "CHOLHDL", "LDLDIRECT" in the last 72 hours. Thyroid Function Tests: Recent Labs     07/12/22 0115  TSH 2.034   Anemia Panel: Recent Labs    07/12/22 0115  VITAMINB12 174*   Sepsis Labs: No results for input(s): "PROCALCITON", "LATICACIDVEN" in the last 168 hours.  Recent Results (from the past 240 hour(s))  SARS Coronavirus 2 by RT PCR (hospital order, performed in [facility] hospital lab) *cepheid single result test* Anterior Nasal Swab     Status: None   Collection Time: 07/12/22  6:40 AM   Specimen: Anterior Nasal Swab  Result Value Ref Range Status   SARS Coronavirus 2 by RT PCR NEGATIVE NEGATIVE Final    Comment: (NOTE) SARS-CoV-2 target nucleic acids are NOT DETECTED.  The SARS-CoV-2 RNA is generally detectable in upper and lower respiratory specimens during the acute phase of infection. The lowest concentration of SARS-CoV-2 viral copies this assay can detect is 250 copies / mL. A negative result does not preclude SARS-CoV-2 infection and should not be used as the sole basis for treatment or other patient management decisions.  A negative result may occur with improper specimen collection / handling, submission of specimen other than nasopharyngeal swab, presence of viral mutation(s) within the areas targeted by this assay, and inadequate number of viral copies (<250 copies / mL). A negative result must be combined with clinical observations, patient history, and epidemiological information.  Fact Sheet for Patients:   https://www.patel.info/  Fact Sheet for Healthcare Providers: https://hall.com/  This test is not yet approved or  cleared by the [place] FDA and has been authorized for detection and/or diagnosis of SARS-CoV-2 by FDA under an Emergency Use Authorization (EUA).  This EUA will remain in effect (meaning this test can be used) for the duration of the COVID-19 declaration under Section 564(b)(1) of the Act, 21 U.S.C. section 360bbb-3(b)(1), unless the authorization is terminated  or revoked sooner.  Performed at [facility], [place] [address], [place], [place] [address]   Respiratory (~20 pathogens) panel by PCR     Status: None   Collection Time: 07/12/22  6:40 AM   Specimen: Anterior Nasal Swab; Respiratory  Result Value Ref Range Status   Adenovirus NOT DETECTED NOT DETECTED Final   Coronavirus 229E NOT DETECTED NOT DETECTED Final    Comment: (NOTE) The Coronavirus on the Respiratory Panel, DOES NOT test for the novel  Coronavirus (2019 nCoV)    Coronavirus HKU1 NOT DETECTED NOT DETECTED Final   Coronavirus NL63 NOT DETECTED NOT DETECTED Final   Coronavirus OC43 NOT DETECTED NOT DETECTED Final   Metapneumovirus NOT DETECTED NOT DETECTED Final   Rhinovirus / Enterovirus NOT DETECTED NOT DETECTED Final   Influenza A NOT DETECTED NOT DETECTED Final   Influenza B NOT DETECTED NOT DETECTED Final   Parainfluenza Virus 1 NOT DETECTED NOT DETECTED Final   Parainfluenza Virus 2 NOT DETECTED NOT DETECTED  Final   Parainfluenza Virus 3 NOT DETECTED NOT DETECTED Final   Parainfluenza Virus 4 NOT DETECTED NOT DETECTED Final   Respiratory Syncytial Virus NOT DETECTED NOT DETECTED Final   Bordetella pertussis NOT DETECTED NOT DETECTED Final   Bordetella Parapertussis NOT DETECTED NOT DETECTED Final   Chlamydophila pneumoniae NOT DETECTED NOT DETECTED Final   Mycoplasma pneumoniae NOT DETECTED NOT DETECTED Final    Comment: Performed at [facility], [place] [address], [place], [place] [address]    Radiology Studies: CT HEAD WO CONTRAST (5MM)  Result Date: 07/12/2022 CLINICAL DATA:  Mental status change, unknown cause EXAM: CT HEAD WITHOUT CONTRAST TECHNIQUE: Contiguous axial images were obtained from the base of the skull through the vertex without intravenous contrast. RADIATION DOSE REDUCTION: This exam was performed according to the departmental dose-optimization program which includes automated exposure control, adjustment of the mA and/or kV  according to patient size and/or use of iterative reconstruction technique. COMPARISON:  09/14/2020. FINDINGS: Brain: There is periventricular white matter decreased attenuation consistent with small vessel ischemic changes. Ventricles, sulci and cisterns are prominent consistent with age related involutional changes. No acute intracranial hemorrhage, mass effect or shift. No hydrocephalus. Left frontal encephalomalacia identified which is stable finding. Old CVA right frontal convexity. 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 Vascular: No hyperdense vessel or unexpected calcification. Skull: Normal. Negative for fracture or focal lesion. Sinuses/Orbits: No acute finding. IMPRESSION: Atrophy and chronic small vessel ischemic changes. Chronic bilateral CVAs. Previous aneurysm repair. No acute intracranial process identified. Electronically Signed   By: Sammie Bench M.D.   On: 07/12/2022 11:29    Scheduled Meds: Continuous Infusions:  sodium chloride 100 mL/hr at 07/13/22 1521    LOS: 1 day   Raiford Noble, DO [facility] Available via Epic secure chat 7am-7pm After these hours, please refer to coverage provider listed on [email or web address] 07/13/2022, 3:59 PM

## 2022-07-13 NOTE — Progress Notes (Signed)
During shift change patient ambulating in hallway. Agitated and wandering into other patient rooms. Refusing to go back to her room or sit down at this time. Camera operator and NP notified

## 2022-07-14 DIAGNOSIS — N179 Acute kidney failure, unspecified: Secondary | ICD-10-CM | POA: Diagnosis not present

## 2022-07-14 DIAGNOSIS — I10 Essential (primary) hypertension: Secondary | ICD-10-CM | POA: Diagnosis not present

## 2022-07-14 DIAGNOSIS — G9341 Metabolic encephalopathy: Secondary | ICD-10-CM | POA: Diagnosis not present

## 2022-07-14 DIAGNOSIS — T68XXXD Hypothermia, subsequent encounter: Secondary | ICD-10-CM | POA: Diagnosis not present

## 2022-07-14 LAB — COMPREHENSIVE METABOLIC PANEL
ALT: 21 U/L (ref 0–44)
AST: 39 U/L (ref 15–41)
Albumin: 3.2 g/dL — ABNORMAL LOW (ref 3.5–5.0)
Alkaline Phosphatase: 70 U/L (ref 38–126)
Anion gap: 9 (ref 5–15)
BUN: 22 mg/dL (ref 8–23)
CO2: 24 mmol/L (ref 22–32)
Calcium: 9 mg/dL (ref 8.9–10.3)
Chloride: 108 mmol/L (ref 98–111)
Creatinine, Ser: 0.87 mg/dL (ref 0.44–1.00)
GFR, Estimated: >60 mL/min (ref >60)
Glucose, Bld: 104 mg/dL — ABNORMAL HIGH (ref 70–99)
Potassium: 3.7 mmol/L (ref 3.5–5.1)
Sodium: 141 mmol/L (ref 135–145)
Total Bilirubin: 0.7 mg/dL (ref 0.3–1.2)
Total Protein: 6.3 g/dL — ABNORMAL LOW (ref 6.5–8.1)

## 2022-07-14 LAB — RETICULOCYTES
Immature Retic Fract: 10 % (ref 2.3–15.9)
RBC.: 4.66 MIL/uL (ref 3.87–5.11)
Retic Count, Absolute: 66.2 10*3/uL (ref 19.0–186.0)
Retic Ct Pct: 1.4 % (ref 0.4–3.1)

## 2022-07-14 LAB — CBC
HCT: 41.4 % (ref 36.0–46.0)
Hemoglobin: 12.6 g/dL (ref 12.0–15.0)
MCH: 27 pg (ref 26.0–34.0)
MCHC: 30.4 g/dL (ref 30.0–36.0)
MCV: 88.8 fL (ref 80.0–100.0)
Platelets: 174 10*3/uL (ref 150–400)
RBC: 4.66 MIL/uL (ref 3.87–5.11)
RDW: 14.3 % (ref 11.5–15.5)
WBC: 3.4 10*3/uL — ABNORMAL LOW (ref 4.0–10.5)
nRBC: 0 % (ref 0.0–0.2)

## 2022-07-14 LAB — VITAMIN B12: Vitamin B-12: 154 pg/mL — ABNORMAL LOW (ref 180–914)

## 2022-07-14 LAB — IRON AND TIBC
Iron: 55 ug/dL (ref 28–170)
Saturation Ratios: 18 % (ref 10.4–31.8)
TIBC: 305 ug/dL (ref 250–450)
UIBC: 250 ug/dL

## 2022-07-14 LAB — PHOSPHORUS: Phosphorus: 3.4 mg/dL (ref 2.5–4.6)

## 2022-07-14 LAB — FERRITIN: Ferritin: 101 ng/mL (ref 11–307)

## 2022-07-14 LAB — FOLATE: Folate: 9.9 ng/mL (ref >5.9)

## 2022-07-14 LAB — MAGNESIUM: Magnesium: 1.6 mg/dL — ABNORMAL LOW (ref 1.7–2.4)

## 2022-07-14 MED ORDER — CYANOCOBALAMIN 1000 MCG/ML IJ SOLN
1000.0000 ug | Freq: Once | INTRAMUSCULAR | Status: AC
  Administered 2022-07-14: 1000 ug via INTRAMUSCULAR
  Filled 2022-07-14: qty 1

## 2022-07-14 MED ORDER — VITAMIN B-12 1000 MCG PO TABS
1000.0000 ug | ORAL_TABLET | Freq: Every day | ORAL | Status: DC
  Administered 2022-07-15 – 2022-07-20 (×6): 1000 ug via ORAL
  Filled 2022-07-14 (×6): qty 1

## 2022-07-14 NOTE — Plan of Care (Signed)

## 2022-07-14 NOTE — Progress Notes (Signed)
Patient continues with agitation and combative behaviors this shift. Provider aware

## 2022-07-14 NOTE — Progress Notes (Signed)
OT Cancellation Note  Patient Details Name: [name] MRN: [id number] DOB: [birth year]   Cancelled Treatment:    Reason Eval/Treat Not Completed: Patient's level of consciousness. Patient aggressive and combative overnight requiring sedating medications. Patient currently unable to be aroused. Will f/u as able.  Jaxyn Mestas L Shyenne Maggard 07/14/2022, 11:23 AM

## 2022-07-14 NOTE — Progress Notes (Signed)
PROGRESS NOTE    Adriana FRIGON  OVZ:[id number] DOB: 08/09/1946 DOA: 07/12/2022 PCP: Velna Hatchet, MD   Brief Narrative:  HPI per Dr. Tennis Must BROCK Navarro is a 76 y.o. female with medical history significant of hypertension, breast cancer, unspecified class obesity who was brought to the emergency department via EMS after the patient was confused.  She lives by herself.  She has a son who lives in the area who checks on her.  Neighbors felt that she was confused, but it is unclear who called EMS.  She was found to be hypothermic due to cold exposure.  She stated she went outside only for 30 minutes and was well dressed.  She also stated she is only going to stay because his son asked her to.  She is still disoriented to time, date and situation.  She denied headache, abdominal, back or chest pain at this point.,  No cough or wheezing according to the patient. She stated she still smokes several cigarettes a day.  As stated she has been eating.  No diarrhea or constipation.  Denied urinary symptoms.   ED course: Initial vital signs were temperature 90.9 F, pulse 55, respirations 17, BP 137/82 mmHg O2 sat 100% on room air.  The patient received warming measures and 1000 mL old normal saline bolus.  I added LR 1000 mL bolus.   Lab work: Her urinalysis was hazy with ketonuria 5 and proteinuria 30 mg deciliter with rare bacteria microscopic examination.  CBC showed a white count 3.9, hemoglobin 11.6 g/dL platelets 220.  CMP with a creatinine of 1.40 mg/deciliter, but otherwise normal.  Her previous creatinine levels have being under 1.   **Interim History She likely presented with environmental hypothermia we are ruling out infection.  She is improving and COVID testing is negative.    Patient acutely became confused last night and had to be given Haldol and then subsequently started becoming further agitated and wandering into other patient's room.  She continued to have agitation and  combative hairs throughout the shift and nighttime provider was made aware and she was given sedating meds and this morning she was extremely somnolent and drowsy and difficult to arouse.  If she continues to be agitated and have wandering behaviors may need to call psychiatry.  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 -Multiple strokes on previous CT head and active smoking. -She stated she has smoked since her 38s. -Inpatient/PCU admission. -TSH was normal. Previously low normal B12 level (09/13/2020) and Recheck B12 level still low at 174 -Will start B12 Supplementation -Urinalysis done and showed a hazy appearance with amber-colored urine, negative glucose, negative hemoglobin, negative nitrites, negative leukocytes, rare bacteria, 0-5 squamous epithelial cells, 0-5 RBCs per high-power field, and 0-5 WBCs -Obtained CT head and showed "Atrophy and chronic small vessel ischemic changes. Chronic bilateral CVAs. Previous aneurysm repair. No acute intracranial process identified." -Initiated Haldol given her agitation and confusion -Respiratory virus panel via PCR is negative -Reorient as needed. -Consulted transitional care team. Patient lives at home and there is living concerns due to lack of heat -If she remains persistently confused may need to consult psychiatry given her wandering behaviors as well as her's extreme agitation overnight -PT/OT to evaluate and Treat    Hypothermia -Resolved. -Infection less likely as she is afebrile and has no leukocytosis and has no localizing symptoms  Sinus bradycardia -Secondary to hypothermia. -Already resolved. -TSH was 2.024 -Continue monitor on telemetry   AKI (acute kidney injury) ([place])  -Continued IV fluids. -Hold ARB/ACE. -BUN/creatinine has improved and gone from 23/1.40  is now 25/0.98 yesterday but repeat is still pending today -Avoid further nephrotoxic medications, contrast dyes, hypotension and dehydration to ensure adequate renal perfusion will need to renally dose medications -Will need to monitor strict input and output and continue monitor renal function carefully and trend electrolytes and repeat CMP in the a.m.   Essential hypertension -Hold losartan for now given her hypotension on admission. -Continue to monitor blood pressures per protocol -Last blood pressure reading is now 128/60 today   Normocytic Anemia -Patient's hemoglobin/hematocrit has gone from 11.6/36.1 and is now 10.4/32.4 on last check not repeated today given her confusion and agitation -Anemia panel was checked and showed an iron level of 55, UIBC of 250, TIBC of 305, saturation ratios of 18%, ferritin level 101, folate level 9.9, vitamin B12 level 154 -Will supplement vitamin B12 once IM cyanocobalamin today and then p.o. tomorrow -Continue to monitor for signs and symptoms of bleeding; no overt bleeding noted -Repeat CBC in a.m.   Hypoalbuminemia -Mild. The patient's albumin level went from 3.7 is now 3.1 again -Continue to monitor and trend and repeat CMP in a.m.   Obesity -Complicates overall prognosis and care -Estimated body mass index is 31 kg/m as calculated from the following:   Height as of 12/29/21: '5\' 3"'$  (1.6 m).   Weight as of 12/29/21: 79.4 kg.  -Weight Loss and Dietary Counseling given  DVT prophylaxis: SCDs Start: 07/12/22 0803    Code Status: Full Code Family Communication: No family currently at bedside  Disposition Plan:  Level of care: Progressive Status is: Inpatient Remains inpatient appropriate because: She is extremely agitated overnight and aggressive and this morning was sedated.  Will need close monitoring and improvement in her mental status and may be a psych consult prior to discharge   Consultants:  None  Procedures:  As delineated as  above  Antimicrobials:  Anti-infectives (From admission, onward)    None       Subjective: Seen and examined at bedside and she is extremely somnolent and drowsy after she had been given sedating medications and she was difficult to arouse.  She was mostly asleep and did not rouse for physical examination or subjective questioning.  No family at bedside.  Objective: Vitals:   07/13/22 1217 07/14/22 0346 07/14/22 1237 07/14/22 1500  BP: 138/68 (!) 157/67 (!) 161/79 128/63  Pulse: 64 62 60 64  Resp: '16 18 18 19  '$ Temp: 98 F (36.7 C)  98.2 F (36.8 C) 98.2 F (36.8 C)  TempSrc: Oral Oral Oral Oral  SpO2: 100% 98% 98% 99%    Intake/Output Summary (Last 24 hours) at 07/14/2022 1833 Last data filed at 07/14/2022 1655 Gross per 24 hour  Intake 120 ml  Output 1050 ml  Net -930 ml   There were no vitals filed for this visit.  Examination: Physical Exam:  Constitutional: African-American female who is extremely somnolent and drowsy and difficult to arouse given her current condition Respiratory: Diminished to auscultation bilaterally, no wheezing, rales, rhonchi or crackles. Normal respiratory effort and patient is not tachypenic. No accessory muscle use.  Unlabored breathing Cardiovascular: RRR, no murmurs / rubs / gallops. S1 and S2 auscultated. No extremity edema. Abdomen: Soft, non-tender, slightly distended secondary body habitus.  Bowel sounds positive.  GU: Deferred. Musculoskeletal: No clubbing /  cyanosis of digits/nails. No joint deformity upper and lower extremities.  Skin: No rashes, lesions, ulcers on limited skin evaluation. No induration; Warm and dry.  Neurologic: Is extremely somnolent and drowsy and not able to participate in neurologic examination Psychiatric: Impaired judgment and insight given that she is somnolent and drowsy  Data Reviewed: I have personally reviewed following labs and imaging studies  CBC: Recent Labs  Lab 07/12/22 0115 07/13/22 0413   WBC 3.9* 4.6  NEUTROABS 2.2  --   HGB 11.6* 10.4*  HCT 36.1 32.4*  MCV 83.6 83.7  PLT 220 308   Basic Metabolic Panel: Recent Labs  Lab 07/12/22 0115 07/13/22 0413  NA 138 142  K 4.0 3.5  CL 103 110  CO2 25 23  GLUCOSE 98 71  BUN 23 25*  CREATININE 1.40* 0.98  CALCIUM 9.3 8.7*   GFR: CrCl cannot be calculated (Unknown ideal weight.). Liver Function Tests: Recent Labs  Lab 07/12/22 0115 07/13/22 0413  AST 32 32  ALT 18 15  ALKPHOS 83 67  BILITOT 0.7 0.7  PROT 7.3 6.2*  ALBUMIN 3.7 3.1*   No results for input(s): "LIPASE", "AMYLASE" in the last 168 hours. No results for input(s): "AMMONIA" in the last 168 hours. Coagulation Profile: No results for input(s): "INR", "PROTIME" in the last 168 hours. Cardiac Enzymes: No results for input(s): "CKTOTAL", "CKMB", "CKMBINDEX", "TROPONINI" in the last 168 hours. BNP (last 3 results) No results for input(s): "PROBNP" in the last 8760 hours. HbA1C: No results for input(s): "HGBA1C" in the last 72 hours. CBG: Recent Labs  Lab 07/12/22 0046  GLUCAP 105*   Lipid Profile: No results for input(s): "CHOL", "HDL", "LDLCALC", "TRIG", "CHOLHDL", "LDLDIRECT" in the last 72 hours. Thyroid Function Tests: Recent Labs    07/12/22 0115  TSH 2.034   Anemia Panel: Recent Labs    07/12/22 0115 07/14/22 0424  VITAMINB12 174* 154*  FOLATE  --  9.9  FERRITIN  --  101  TIBC  --  305  IRON  --  55   Sepsis Labs: No results for input(s): "PROCALCITON", "LATICACIDVEN" in the last 168 hours.  Recent Results (from the past 240 hour(s))  SARS Coronavirus 2 by RT PCR (hospital order, performed in [facility] hospital lab) *cepheid single result test* Anterior Nasal Swab     Status: None   Collection Time: 07/12/22  6:40 AM   Specimen: Anterior Nasal Swab  Result Value Ref Range Status   SARS Coronavirus 2 by RT PCR NEGATIVE NEGATIVE Final    Comment: (NOTE) SARS-CoV-2 target nucleic acids are NOT DETECTED.  The SARS-CoV-2  RNA is generally detectable in upper and lower respiratory specimens during the acute phase of infection. The lowest concentration of SARS-CoV-2 viral copies this assay can detect is 250 copies / mL. A negative result does not preclude SARS-CoV-2 infection and should not be used as the sole basis for treatment or other patient management decisions.  A negative result may occur with improper specimen collection / handling, submission of specimen other than nasopharyngeal swab, presence of viral mutation(s) within the areas targeted by this assay, and inadequate number of viral copies (<250 copies / mL). A negative result must be combined with clinical observations, patient history, and epidemiological information.  Fact Sheet for Patients:   https://www.patel.info/  Fact Sheet for Healthcare Providers: https://hall.com/  This test is not yet approved or  cleared by the [place] FDA and has been authorized for detection and/or diagnosis of SARS-CoV-2 by FDA  under an Emergency Use Authorization (EUA).  This EUA will remain in effect (meaning this test can be used) for the duration of the COVID-19 declaration under Section 564(b)(1) of the Act, 21 U.S.C. section 360bbb-3(b)(1), unless the authorization is terminated or revoked sooner.  Performed at [facility], [place] [address] [place], [place] [address]   Respiratory (~20 pathogens) panel by PCR     Status: None   Collection Time: 07/12/22  6:40 AM   Specimen: Anterior Nasal Swab; Respiratory  Result Value Ref Range Status   Adenovirus NOT DETECTED NOT DETECTED Final   Coronavirus 229E NOT DETECTED NOT DETECTED Final    Comment: (NOTE) The Coronavirus on the Respiratory Panel, DOES NOT test for the novel  Coronavirus (2019 nCoV)    Coronavirus HKU1 NOT DETECTED NOT DETECTED Final   Coronavirus NL63 NOT DETECTED NOT DETECTED Final   Coronavirus OC43 NOT DETECTED  NOT DETECTED Final   Metapneumovirus NOT DETECTED NOT DETECTED Final   Rhinovirus / Enterovirus NOT DETECTED NOT DETECTED Final   Influenza A NOT DETECTED NOT DETECTED Final   Influenza B NOT DETECTED NOT DETECTED Final   Parainfluenza Virus 1 NOT DETECTED NOT DETECTED Final   Parainfluenza Virus 2 NOT DETECTED NOT DETECTED Final   Parainfluenza Virus 3 NOT DETECTED NOT DETECTED Final   Parainfluenza Virus 4 NOT DETECTED NOT DETECTED Final   Respiratory Syncytial Virus NOT DETECTED NOT DETECTED Final   Bordetella pertussis NOT DETECTED NOT DETECTED Final   Bordetella Parapertussis NOT DETECTED NOT DETECTED Final   Chlamydophila pneumoniae NOT DETECTED NOT DETECTED Final   Mycoplasma pneumoniae NOT DETECTED NOT DETECTED Final    Comment: Performed at [facility], [place]. [address] [place], [place] [address]    Radiology Studies: No results found.  Scheduled Meds: Continuous Infusions:  sodium chloride 100 mL/hr at 07/14/22 1653    LOS: 2 days   Raiford Noble, DO [facility] Available via Epic secure chat 7am-7pm After these hours, please refer to coverage provider listed on [email or web address] 07/14/2022, 6:33 PM

## 2022-07-15 DIAGNOSIS — I10 Essential (primary) hypertension: Secondary | ICD-10-CM | POA: Diagnosis not present

## 2022-07-15 DIAGNOSIS — G9341 Metabolic encephalopathy: Secondary | ICD-10-CM | POA: Diagnosis not present

## 2022-07-15 DIAGNOSIS — R443 Hallucinations, unspecified: Secondary | ICD-10-CM

## 2022-07-15 DIAGNOSIS — N179 Acute kidney failure, unspecified: Secondary | ICD-10-CM | POA: Diagnosis not present

## 2022-07-15 DIAGNOSIS — R4689 Other symptoms and signs involving appearance and behavior: Secondary | ICD-10-CM

## 2022-07-15 DIAGNOSIS — T68XXXD Hypothermia, subsequent encounter: Secondary | ICD-10-CM | POA: Diagnosis not present

## 2022-07-15 LAB — COMPREHENSIVE METABOLIC PANEL
ALT: 17 U/L (ref 0–44)
AST: 37 U/L (ref 15–41)
Albumin: 3.2 g/dL — ABNORMAL LOW (ref 3.5–5.0)
Alkaline Phosphatase: 73 U/L (ref 38–126)
Anion gap: 13 (ref 5–15)
BUN: 18 mg/dL (ref 8–23)
CO2: 20 mmol/L — ABNORMAL LOW (ref 22–32)
Calcium: 9.2 mg/dL (ref 8.9–10.3)
Chloride: 109 mmol/L (ref 98–111)
Creatinine, Ser: 0.76 mg/dL (ref 0.44–1.00)
GFR, Estimated: >60 mL/min (ref >60)
Glucose, Bld: 65 mg/dL — ABNORMAL LOW (ref 70–99)
Potassium: 4.4 mmol/L (ref 3.5–5.1)
Sodium: 142 mmol/L (ref 135–145)
Total Bilirubin: 0.8 mg/dL (ref 0.3–1.2)
Total Protein: 6.3 g/dL — ABNORMAL LOW (ref 6.5–8.1)

## 2022-07-15 LAB — CBC WITH DIFFERENTIAL/PLATELET
Abs Immature Granulocytes: 0.01 10*3/uL (ref 0.00–0.07)
Basophils Absolute: 0 10*3/uL (ref 0.0–0.1)
Basophils Relative: 1 %
Eosinophils Absolute: 0.1 10*3/uL (ref 0.0–0.5)
Eosinophils Relative: 3 %
HCT: 34.9 % — ABNORMAL LOW (ref 36.0–46.0)
Hemoglobin: 11.1 g/dL — ABNORMAL LOW (ref 12.0–15.0)
Immature Granulocytes: 0 %
Lymphocytes Relative: 42 %
Lymphs Abs: 1.8 10*3/uL (ref 0.7–4.0)
MCH: 27.2 pg (ref 26.0–34.0)
MCHC: 31.8 g/dL (ref 30.0–36.0)
MCV: 85.5 fL (ref 80.0–100.0)
Monocytes Absolute: 0.6 10*3/uL (ref 0.1–1.0)
Monocytes Relative: 14 %
Neutro Abs: 1.7 10*3/uL (ref 1.7–7.7)
Neutrophils Relative %: 40 %
Platelets: 197 10*3/uL (ref 150–400)
RBC: 4.08 MIL/uL (ref 3.87–5.11)
RDW: 14 % (ref 11.5–15.5)
WBC: 4.3 10*3/uL (ref 4.0–10.5)
nRBC: 0 % (ref 0.0–0.2)

## 2022-07-15 LAB — PHOSPHORUS: Phosphorus: 3.6 mg/dL (ref 2.5–4.6)

## 2022-07-15 LAB — MAGNESIUM: Magnesium: 1.7 mg/dL (ref 1.7–2.4)

## 2022-07-15 MED ORDER — MAGNESIUM SULFATE 2 GM/50ML IV SOLN
2.0000 g | Freq: Once | INTRAVENOUS | Status: AC
  Administered 2022-07-15: 2 g via INTRAVENOUS
  Filled 2022-07-15: qty 50

## 2022-07-15 MED ORDER — SODIUM CHLORIDE 0.9 % IV SOLN: INTRAVENOUS | Status: AC

## 2022-07-15 MED ORDER — LIDOCAINE 5 % EX PTCH
1.0000 | MEDICATED_PATCH | CUTANEOUS | Status: DC
  Administered 2022-07-15 – 2022-07-20 (×6): 1 via TRANSDERMAL
  Filled 2022-07-15 (×6): qty 1

## 2022-07-15 NOTE — Plan of Care (Signed)

## 2022-07-15 NOTE — Evaluation (Signed)
Physical Therapy Evaluation Patient Details Name: [name] MRN: [id number] DOB: January 22, 1946 Today's Date: 07/15/2022  History of Present Illness  76 y.o. female admitted with acute metabolic encephalopathy. She was brought to the emergency department via EMS after the patient was found confused at home. She presented with environmental hypothermia (gas has been turned off at home) but also AMS with unclear etiology. History significant of hypertension, breast cancer, unspecified class obesity, CVA  Clinical Impression  On eval, pt was Min A for mobility. She walked ~225 feet around the unit with a RW. She is unsteady at times and needed intermittent assist to maneuver with RW. She is confused-hallucinating/paranoid-thinks there is a woman in the room watching her. No family present during session. Will plan to follow pt during this hospital stay. At this time, recommendation is for ST SNF unless pt will have 24/7 supervision/assist available at home.        Recommendations for follow up therapy are one component of a multi-disciplinary discharge planning process, led by the attending physician.  Recommendations may be updated based on patient status, additional functional criteria and insurance authorization.  Follow Up Recommendations Skilled nursing-short term rehab (<3 hours/day) Can patient physically be transported by private vehicle: Yes    Assistance Recommended at Discharge Frequent or constant Supervision/Assistance  Patient can return home with the following  A little help with walking and/or transfers;A little help with bathing/dressing/bathroom;Assistance with cooking/housework;Assist for transportation;Help with stairs or ramp for entrance    Equipment Recommendations None recommended by PT  Recommendations for Other Services       Functional Status Assessment Patient has had a recent decline in their functional status and demonstrates the ability to make significant  improvements in function in a reasonable and predictable amount of time.     Precautions / Restrictions Precautions Precautions: Fall Restrictions Weight Bearing Restrictions: No      Mobility  Bed Mobility               General bed mobility comments: oob in recliner    Transfers Overall transfer level: Needs assistance Equipment used: Rolling walker (2 wheels) Transfers: Sit to/from Stand Sit to Stand: Supervision           General transfer comment: Supv for safety. Cues for safety, lines    Ambulation/Gait Ambulation/Gait assistance: Min assist Gait Distance (Feet): 225 Feet Assistive device: Rolling walker (2 wheels) Gait Pattern/deviations: Step-through pattern, Decreased stride length       General Gait Details: Intermittent assist to steady pt and manage RW path. Pt tolerated distance well.  Stairs            Wheelchair Mobility    Modified Bottino (Stroke Patients Only)       Balance Overall balance assessment: Needs assistance         Standing balance support: Bilateral upper extremity supported, During functional activity, Reliant on assistive device for balance Standing balance-Leahy Scale: Fair                               Pertinent Vitals/Pain Pain Assessment Pain Assessment: No/denies pain    Home Living Family/patient expects to be discharged to:: Private residence Living Arrangements: Alone Available Help at Discharge: Family;Available PRN/intermittently Type of Home: House Home Access: Stairs to enter Entrance Stairs-Rails: Right;Left;Can reach both Entrance Stairs-Number of Steps: 3   Home Layout: One level Home Equipment: Conservation officer, nature (2 wheels);Cane - single point;BSC/3in1;Rollator (4  wheels);Shower seat Additional Comments: Patient an unreliable historian. Information taken from prior admission a year ago.    Prior Function               Mobility Comments: Today patient reports she  ambulates with a cane ADLs Comments: REports she is independent, lives alone, takes sponge baths     Hand Dominance   Dominant Hand: Right    Extremity/Trunk Assessment   Upper Extremity Assessment Upper Extremity Assessment: Defer to OT evaluation RUE Deficits / Details: Reports right shoulder pain but exhibits functional ROM and strength RUE Sensation: WNL RUE Coordination: WNL LUE Deficits / Details: WFL ROM and strength LUE Sensation: WNL LUE Coordination: WNL    Lower Extremity Assessment Lower Extremity Assessment: Generalized weakness    Cervical / Trunk Assessment Cervical / Trunk Assessment: Kyphotic  Communication   Communication: No difficulties  Cognition Arousal/Alertness: Awake/alert Behavior During Therapy: WFL for tasks assessed/performed Overall Cognitive Status: No family/caregiver present to determine baseline cognitive functioning                                 General Comments: Pt is hallucinating/paranoid-thinks someone else is in the room watching her        General Comments      Exercises     Assessment/Plan    PT Assessment Patient needs continued PT services  PT Problem List Decreased strength;Decreased range of motion;Decreased activity tolerance;Decreased balance;Decreased mobility       PT Treatment Interventions DME instruction;Gait training;Therapeutic exercise;Balance training;Functional mobility training;Therapeutic activities;Patient/family education    PT Goals (Current goals can be found in the Care Plan section)  Acute Rehab PT Goals Patient Stated Goal: none stated PT Goal Formulation: Patient unable to participate in goal setting Time For Goal Achievement: 07/29/22 Potential to Achieve Goals: Good    Frequency Min 2X/week     Co-evaluation               AM-PAC PT "6 Clicks" Mobility  Outcome Measure Help needed turning from your back to your side while in a flat bed without using bedrails?:  A Little Help needed moving from lying on your back to sitting on the side of a flat bed without using bedrails?: A Little Help needed moving to and from a bed to a chair (including a wheelchair)?: A Little Help needed standing up from a chair using your arms (e.g., wheelchair or bedside chair)?: A Little Help needed to walk in hospital room?: A Little Help needed climbing 3-5 steps with a railing? : A Little 6 Click Score: 18    End of Session Equipment Utilized During Treatment: Gait belt Activity Tolerance: Patient tolerated treatment well Patient left: in chair;with call bell/phone within reach;with chair alarm set   PT Visit Diagnosis: Muscle weakness (generalized) (M62.81);Difficulty in walking, not elsewhere classified (R26.2)    Time: 4270-6237 PT Time Calculation (min) (ACUTE ONLY): 11 min   Charges:   PT Evaluation $PT Eval Low Complexity: [place], PT Acute Rehabilitation  Office: [phone]

## 2022-07-15 NOTE — Evaluation (Signed)
Occupational Therapy Evaluation Patient Details Name: [name] MRN: [id number] DOB: 10-14-1945 Today's Date: 07/15/2022   History of Present Illness Patient admitted with acute metabolic encephalopathy.[name] is a 76 y.o. female with medical history significant of hypertension, breast cancer, unspecified class obesity who was brought to the emergency department via EMS after the patient was confused. She presented with environmental hypothermia (gas has been turned off at home) but AMS with unclear etiology but Suspect superimposed vascular dementia. CT head showed chronic  bilateral CVAs.   Clinical Impression   [name] is a 76 year old woman who presents with confusion, generalized weakness and impaired balance. At baseline patient lives alone and is independent with ADLs. She reports - though she is an unreliable historian - walking with a cane and taking sponge baths and her son helps her with grocery shopping. Her gas has been shut of recently and patient was hypothermic on admission. Unsure of her baseline cognition but she continues to be altered with some confusion stating "Shhh. They are watching on cameras." She is alert to self and place but not situation or time. She requires supervision and verbal cues for safety, motor planning and quality of task. Today she was min assist for supine to sit, min assist to rise from low surface and min guard for ambulation. She was able to stand at sink to perform grooming and upper body bathing. Overall she is set up and supervision for UB ADLs and min-mod assist for LB ADLs. Patient is currently not safe to return home without 24/7 assistance. She does not exhibit the cognitive abilities to safely perform IADLs. Recommend short term rehab at discharge. Of note, patient presented similarly in Feb 2022 - so expect some baseline cognitive deficits.      Recommendations for follow up therapy are one component of a multi-disciplinary  discharge planning process, led by the attending physician.  Recommendations may be updated based on patient status, additional functional criteria and insurance authorization.   Follow Up Recommendations  Skilled nursing-short term rehab (<3 hours/day)     Assistance Recommended at Discharge Intermittent Supervision/Assistance  Patient can return home with the following A little help with bathing/dressing/bathroom;Assistance with cooking/housework;Direct supervision/assist for financial management;Direct supervision/assist for medications management;Help with stairs or ramp for entrance    Functional Status Assessment  Patient has had a recent decline in their functional status and demonstrates the ability to make significant improvements in function in a reasonable and predictable amount of time.  Equipment Recommendations  None recommended by OT    Recommendations for Other Services       Precautions / Restrictions Precautions Precautions: Fall Restrictions Weight Bearing Restrictions: No      Mobility Bed Mobility Overal bed mobility: Needs Assistance Bed Mobility: Supine to Sit     Supine to sit: Min assist     General bed mobility comments: MIn assist for hand to lift trunk and scoot forward. Needed increased time. Reports stiffness in morning due to arthritis that limits mobility initially.    Transfers Overall transfer level: Needs assistance Equipment used: Rolling walker (2 wheels) Transfers: Sit to/from Stand Sit to Stand: Min guard, Min assist           General transfer comment: Use of RW due to not having a cane. MIn guard to stand from elevated bed and min guard for ambulation to bathroom and standing during bathing task. MIn assist to power up from low toilet seat however.  Balance Overall balance assessment: Needs assistance Sitting-balance support: No upper extremity supported Sitting balance-Leahy Scale: Good     Standing balance support:  Single extremity supported Standing balance-Leahy Scale: Fair                             ADL either performed or assessed with clinical judgement   ADL Overall ADL's : Needs assistance/impaired Eating/Feeding: Set up   Grooming: Set up;Standing;Supervision/safety Grooming Details (indicate cue type and reason): at sink Upper Body Bathing: Set up;Standing;Supervision/ safety Upper Body Bathing Details (indicate cue type and reason): in standing at sink. Therapist washed patient's back. Therapist provided verbal cues to perform improved quality of washing but no physical assistance. Lower Body Bathing: Moderate assistance;Sit to/from stand Lower Body Bathing Details (indicate cue type and reason): To reach lower extremities and improve quality of care with pericare Upper Body Dressing : Set up;Sitting   Lower Body Dressing: Sit to/from stand;Minimal assistance Lower Body Dressing Details (indicate cue type and reason): Patient able to doff socks and slide on shoes, min assist for donning socks and min guard with standing Toilet Transfer: Minimal assistance;Rolling walker (2 wheels);Grab bars;Regular Glass blower/designer Details (indicate cue type and reason): MIn assist to power up from low surface with increased time Toileting- Clothing Manipulation and Hygiene: Minimal assistance;Sit to/from stand Toileting - Clothing Manipulation Details (indicate cue type and reason): min assist to improve quality of pericare     Functional mobility during ADLs: Minimal assistance;Rolling walker (2 wheels) General ADL Comments: Min assist for power up from low surface otherwise min guard     Vision   Vision Assessment?: No apparent visual deficits     Perception     Praxis      Pertinent Vitals/Pain Pain Assessment Pain Assessment: Faces Faces Pain Scale: Hurts a little bit Pain Location: B knee pain (chornic ) Pain Descriptors / Indicators: Aching Pain Intervention(s):  Monitored during session     Hand Dominance Right   Extremity/Trunk Assessment Upper Extremity Assessment Upper Extremity Assessment: RUE deficits/detail;LUE deficits/detail RUE Deficits / Details: Reports right shoulder pain but exhibits functional ROM and strength RUE Sensation: WNL RUE Coordination: WNL LUE Deficits / Details: WFL ROM and strength LUE Sensation: WNL LUE Coordination: WNL   Lower Extremity Assessment Lower Extremity Assessment: Defer to PT evaluation   Cervical / Trunk Assessment Cervical / Trunk Assessment: Kyphotic   Communication Communication Communication: No difficulties   Cognition Arousal/Alertness: Awake/alert Behavior During Therapy: WFL for tasks assessed/performed Overall Cognitive Status: No family/caregiver present to determine baseline cognitive functioning                                 General Comments: Patient alert to self, birthdate. Tried to say 2021 but kept saying a combination of 2021 and 3. Stated it was January. KNew the President. Knew she was in the hospital However, her first statement to therapist was "Shhhh....they are watching with cameras." Due to paranoia did not attempt cognitive assessment.     General Comments       Exercises     Shoulder Instructions      Home Living Family/patient expects to be discharged to:: Private residence Living Arrangements: Alone Available Help at Discharge: Family;Available PRN/intermittently Type of Home: House Home Access: Stairs to enter [employer] of Steps: 3 Entrance Stairs-Rails: Right;Left;Can reach both Home Layout: One level  Bathroom Shower/Tub: Teacher, early years/pre: Standard     Home Equipment: Conservation officer, nature (2 wheels);Cane - single point;BSC/3in1;Rollator (4 wheels);Shower seat   Additional Comments: Patient an unreliable historian. Information taken from prior admission a year ago.      Prior Functioning/Environment                Mobility Comments: Today patient reports she ambulates with a cane ADLs Comments: REports she is independent, lives alone, takes sponge baths        OT Problem List: Decreased strength;Impaired balance (sitting and/or standing);Decreased knowledge of use of DME or AE;Decreased safety awareness;Decreased cognition      OT Treatment/Interventions: Self-care/ADL training;Therapeutic exercise;DME and/or AE instruction;Therapeutic activities;Cognitive remediation/compensation;Balance training;Patient/family education    OT Goals(Current goals can be found in the care plan section) Acute Rehab OT Goals OT Goal Formulation: Patient unable to participate in goal setting Time For Goal Achievement: 07/29/22 Potential to Achieve Goals: Good  OT Frequency: Min 2X/week    Co-evaluation              AM-PAC OT "6 Clicks" Daily Activity     Outcome Measure Help from another person eating meals?: A Little Help from another person taking care of personal grooming?: A Little Help from another person toileting, which includes using toliet, bedpan, or urinal?: A Little Help from another person bathing (including washing, rinsing, drying)?: A Little Help from another person to put on and taking off regular upper body clothing?: A Little Help from another person to put on and taking off regular lower body clothing?: A Lot 6 Click Score: 17   End of Session Equipment Utilized During Treatment: Rolling walker (2 wheels) Nurse Communication: Mobility status  Activity Tolerance: Patient tolerated treatment well Patient left: in chair;with call bell/phone within reach;with chair alarm set  OT Visit Diagnosis: Other symptoms and signs involving cognitive function                Time: 2683-4196 OT Time Calculation (min): 25 min Charges:  OT General Charges $OT Visit: 1 Visit OT Evaluation $OT Eval Low Complexity: 1 Low OT Treatments $Self Care/Home Management : 8-22  mins  Gustavo Lah, OTR/L Acute Care Rehab Services  Office [phone]   Lenward Chancellor 07/15/2022, 9:58 AM

## 2022-07-15 NOTE — Progress Notes (Signed)
PROGRESS NOTE    [name]  UXN:[id number] DOB: [birth year] DOA: 07/12/2022 PCP: Velna Hatchet, MD   Brief Narrative:  HPI per Dr. Tennis Must [name] is a 76 y.o. female with medical history significant of hypertension, breast cancer, unspecified class obesity who was brought to the emergency department via EMS after the patient was confused.  She lives by herself.  She has a son who lives in the area who checks on her.  Neighbors felt that she was confused, but it is unclear who called EMS.  She was found to be hypothermic due to cold exposure.  She stated she went outside only for 30 minutes and was well dressed.  She also stated she is only going to stay because his son asked her to.  She is still disoriented to time, date and situation.  She denied headache, abdominal, back or chest pain at this point.,  No cough or wheezing according to the patient. She stated she still smokes several cigarettes a day.  As stated she has been eating.  No diarrhea or constipation.  Denied urinary symptoms.   ED course: Initial vital signs were temperature 90.9 F, pulse 55, respirations 17, BP 137/82 mmHg O2 sat 100% on room air.  The patient received warming measures and 1000 mL old normal saline bolus.  I added LR 1000 mL bolus.   Lab work: Her urinalysis was hazy with ketonuria 5 and proteinuria 30 mg deciliter with rare bacteria microscopic examination.  CBC showed a white count 3.9, hemoglobin 11.6 g/dL platelets 220.  CMP with a creatinine of 1.40 mg/deciliter, but otherwise normal.  Her previous creatinine levels have being under 1.   **Interim History She likely presented with environmental hypothermia we are ruling out infection.  She is improving and COVID testing is negative.     Patient acutely became confused last night and had to be given Haldol and then subsequently started becoming further agitated and wandering into other patient's room.  She continued to have agitation and  combative hairs throughout the shift and nighttime provider was made aware and she was given sedating meds and this morning she was extremely somnolent and drowsy and difficult to arouse.  If she continues to be agitated and have wandering behaviors may need to call psychiatry.  Will continue IV fluid hydration  Throughout the hospitalization patient has had wandering behaviors and hallucinations and agitation.  PT OT evaluated and recommending SNF.  Will consult psychiatric service for her hallucinations, paranoia, wandering behavior as well as agitation.  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 -Multiple strokes on previous CT head and active smoking. -She stated she has smoked since her 45s. -Inpatient/PCU admission. -TSH was normal. Previously low normal B12 level (09/13/2020) and Recheck B12 level still low at 174 -Will start B12 Supplementation -Urinalysis done and showed a hazy appearance with amber-colored urine, negative glucose, negative hemoglobin, negative nitrites, negative leukocytes, rare bacteria, 0-5 squamous epithelial cells, 0-5 RBCs per high-power field, and 0-5 WBCs -Obtained CT head and showed "Atrophy and chronic small vessel ischemic changes. Chronic bilateral CVAs. Previous aneurysm repair. No acute intracranial process identified." -Initiated Haldol given her agitation and confusion -Respiratory virus panel via PCR is negative -Reorient as needed. -Consulted transitional care team. Patient lives at home and there is living concerns due to lack of heat -Since she remains persistently  confused may need to consult psychiatry given her wandering behaviors as well as her's extreme agitation overnight and paranoia and hallucinations -PT/OT to evaluate and Treat and recommending SNF    Hypothermia -Resolved. -Infection less likely as she is afebrile and has no leukocytosis and has no localizing symptoms   Sinus Bradycardia -Secondary to Hypothermia. -Already resolved. -TSH was 2.024 -Continue monitor on telemetry   AKI (Acute Kidney Injury) ([place])  Metabolic acidosis -Continued IV fluids. -Hold ARB/ACE. -BUN/creatinine has improved and gone from 23/1.40 -> 25/0.98 -> 22/0.87 -> 18/0.76 -Has a slight metabolic acidosis with a CO2 of 20, Anion Gap of 13, chloride level 109 -Avoid further nephrotoxic medications, contrast dyes, hypotension and dehydration to ensure adequate renal perfusion will need to renally dose medications -Will need to monitor strict input and output and continue monitor renal function carefully and trend electrolytes and repeat CMP in the a.m.   Essential Hypertension -Hold Losartan for now given her hypotension on admission. -Continue to monitor blood pressures per protocol -Last blood pressure reading is now 164/82 today  Agitation and wandering and confusion as well as paranoia -Consult psych for further evaluation of the metabolic issues are being improved   Normocytic Anemia -Patient's hemoglobin/hematocrit has gone from 11.6/36.1 -> 10.4/32.4 -> 12.6/41.4 -> 11.1/34.9 -Anemia panel was checked and showed an iron level of 55, UIBC of 250, TIBC of 305, saturation ratios of 18%, ferritin level 101, folate level 9.9, vitamin B12 level 154 -Will supplement vitamin B12 once IM cyanocobalamin today and then p.o. tomorrow -Continue to monitor for signs and symptoms of bleeding; no overt bleeding noted -Repeat CBC in a.m.   Hypoalbuminemia -Mild. The patient's albumin level went from 3.7 -> 3.1 -> 3.2 -> 3.2 x2 -Continue to monitor and trend and repeat CMP in a.m.   Obesity -Complicates overall prognosis and care -Estimated body mass index is 31 kg/m as calculated from the following:   Height as of 12/29/21: '5\' 3"'$  (1.6 m).   Weight as of  12/29/21: 79.4 kg.  -Weight Loss and Dietary Counseling given  DVT prophylaxis: SCDs Start: 07/12/22 0803    Code Status: Full Code Family Communication: No family currently at bedside  Disposition Plan:  Level of care: Progressive Status is: Inpatient Remains inpatient appropriate because: PT OT recommending SNF and will need safe discharge disposition and will consult psych for further evaluation given her paranoia agitation wandering behaviors as well as hallucinations   Consultants:  Psychiatry  Procedures:  As delineated as above  Antimicrobials:  Anti-infectives (From admission, onward)    None       Subjective: Seen and examined at bedside and she has been wandering hallucinating per the nursing staff.  Had become agitated yesterday.  No nausea or vomiting.  States that she is feeling okay.  No other concerns or complaints at this time.  Objective: Vitals:   07/14/22 1500 07/14/22 1953 07/15/22 0440 07/15/22 1247  BP: 128/63 130/72 138/72 (!) 164/82  Pulse: 64 60 62 65  Resp: '19 19 18 18  '$ Temp: 98.2 F (36.8 C) 97.9 F (36.6 C) 97.6 F (36.4 C)   TempSrc: Oral Oral Oral   SpO2: 99% 100% 100% 100%    Intake/Output Summary (Last 24 hours) at 07/15/2022 1722 Last data filed at 07/15/2022 1600 Gross per 24 hour  Intake 348.49 ml  Output 700 ml  Net -351.51 ml   There were no vitals filed for this visit.  Examination: Physical Exam:  Constitutional: WN/WD obese elderly African-American  female currently in no acute distress but is intermittently confused Respiratory: Diminished to auscultation bilaterally, no wheezing, rales, rhonchi or crackles. Normal respiratory effort and patient is not tachypenic. No accessory muscle use.  Unlabored breathing Cardiovascular: RRR, no murmurs / rubs / gallops. S1 and S2 auscultated. No extremity edema. Abdomen: Soft, non-tender, distended secondary to body habitus. Bowel sounds positive.  GU: Deferred. Musculoskeletal: No  clubbing / cyanosis of digits/nails. No joint deformity upper and lower extremities.  Skin: No rashes, lesions, ulcers on limited skin evaluation. No induration; Warm and dry.  Neurologic: CN 2-12 grossly intact with no focal deficits. Romberg sign and cerebellar reflexes not assessed.  Psychiatric: Normal judgment and insight. Alert and oriented x 3. Normal mood and appropriate affect.   Data Reviewed: I have personally reviewed following labs and imaging studies  CBC: Recent Labs  Lab 07/12/22 0115 07/13/22 0413 07/14/22 1904 07/15/22 0640  WBC 3.9* 4.6 3.4* 4.3  NEUTROABS 2.2  --   --  1.7  HGB 11.6* 10.4* 12.6 11.1*  HCT 36.1 32.4* 41.4 34.9*  MCV 83.6 83.7 88.8 85.5  PLT 220 205 174 244   Basic Metabolic Panel: Recent Labs  Lab 07/12/22 0115 07/13/22 0413 07/14/22 1904 07/15/22 0349  NA 138 142 141 142  K 4.0 3.5 3.7 4.4  CL 103 110 108 109  CO2 '25 23 24 '$ 20*  GLUCOSE 98 71 104* 65*  BUN 23 25* 22 18  CREATININE 1.40* 0.98 0.87 0.76  CALCIUM 9.3 8.7* 9.0 9.2  MG  --   --  1.6* 1.7  PHOS  --   --  3.4 3.6   GFR: CrCl cannot be calculated (Unknown ideal weight.). Liver Function Tests: Recent Labs  Lab 07/12/22 0115 07/13/22 0413 07/14/22 1904 07/15/22 0349  AST 32 32 39 37  ALT '18 15 21 17  '$ ALKPHOS 83 67 70 73  BILITOT 0.7 0.7 0.7 0.8  PROT 7.3 6.2* 6.3* 6.3*  ALBUMIN 3.7 3.1* 3.2* 3.2*   No results for input(s): "LIPASE", "AMYLASE" in the last 168 hours. No results for input(s): "AMMONIA" in the last 168 hours. Coagulation Profile: No results for input(s): "INR", "PROTIME" in the last 168 hours. Cardiac Enzymes: No results for input(s): "CKTOTAL", "CKMB", "CKMBINDEX", "TROPONINI" in the last 168 hours. BNP (last 3 results) No results for input(s): "PROBNP" in the last 8760 hours. HbA1C: No results for input(s): "HGBA1C" in the last 72 hours. CBG: Recent Labs  Lab 07/12/22 0046  GLUCAP 105*   Lipid Profile: No results for input(s): "CHOL",  "HDL", "LDLCALC", "TRIG", "CHOLHDL", "LDLDIRECT" in the last 72 hours. Thyroid Function Tests: No results for input(s): "TSH", "T4TOTAL", "FREET4", "T3FREE", "THYROIDAB" in the last 72 hours. Anemia Panel: Recent Labs    07/14/22 0424 07/14/22 1904  VITAMINB12 154*  --   FOLATE 9.9  --   FERRITIN 101  --   TIBC 305  --   IRON 55  --   RETICCTPCT  --  1.4   Sepsis Labs: No results for input(s): "PROCALCITON", "LATICACIDVEN" in the last 168 hours.  Recent Results (from the past 240 hour(s))  SARS Coronavirus 2 by RT PCR (hospital order, performed in [facility] hospital lab) *cepheid single result test* Anterior Nasal Swab     Status: None   Collection Time: 07/12/22  6:40 AM   Specimen: Anterior Nasal Swab  Result Value Ref Range Status   SARS Coronavirus 2 by RT PCR NEGATIVE NEGATIVE Final    Comment: (NOTE) SARS-CoV-2 target  nucleic acids are NOT DETECTED.  The SARS-CoV-2 RNA is generally detectable in upper and lower respiratory specimens during the acute phase of infection. The lowest concentration of SARS-CoV-2 viral copies this assay can detect is 250 copies / mL. A negative result does not preclude SARS-CoV-2 infection and should not be used as the sole basis for treatment or other patient management decisions.  A negative result may occur with improper specimen collection / handling, submission of specimen other than nasopharyngeal swab, presence of viral mutation(s) within the areas targeted by this assay, and inadequate number of viral copies (<250 copies / mL). A negative result must be combined with clinical observations, patient history, and epidemiological information.  Fact Sheet for Patients:   https://www.patel.info/  Fact Sheet for Healthcare Providers: https://hall.com/  This test is not yet approved or  cleared by the [place] FDA and has been authorized for detection and/or diagnosis of SARS-CoV-2 by FDA  under an Emergency Use Authorization (EUA).  This EUA will remain in effect (meaning this test can be used) for the duration of the COVID-19 declaration under Section 564(b)(1) of the Act, 21 U.S.C. section 360bbb-3(b)(1), unless the authorization is terminated or revoked sooner.  Performed at [facility], [place] [address] [place], [place] [address]   Respiratory (~20 pathogens) panel by PCR     Status: None   Collection Time: 07/12/22  6:40 AM   Specimen: Anterior Nasal Swab; Respiratory  Result Value Ref Range Status   Adenovirus NOT DETECTED NOT DETECTED Final   Coronavirus 229E NOT DETECTED NOT DETECTED Final    Comment: (NOTE) The Coronavirus on the Respiratory Panel, DOES NOT test for the novel  Coronavirus (2019 nCoV)    Coronavirus HKU1 NOT DETECTED NOT DETECTED Final   Coronavirus NL63 NOT DETECTED NOT DETECTED Final   Coronavirus OC43 NOT DETECTED NOT DETECTED Final   Metapneumovirus NOT DETECTED NOT DETECTED Final   Rhinovirus / Enterovirus NOT DETECTED NOT DETECTED Final   Influenza A NOT DETECTED NOT DETECTED Final   Influenza B NOT DETECTED NOT DETECTED Final   Parainfluenza Virus 1 NOT DETECTED NOT DETECTED Final   Parainfluenza Virus 2 NOT DETECTED NOT DETECTED Final   Parainfluenza Virus 3 NOT DETECTED NOT DETECTED Final   Parainfluenza Virus 4 NOT DETECTED NOT DETECTED Final   Respiratory Syncytial Virus NOT DETECTED NOT DETECTED Final   Bordetella pertussis NOT DETECTED NOT DETECTED Final   Bordetella Parapertussis NOT DETECTED NOT DETECTED Final   Chlamydophila pneumoniae NOT DETECTED NOT DETECTED Final   Mycoplasma pneumoniae NOT DETECTED NOT DETECTED Final    Comment: Performed at [facility], [place]. [address], [place], [place] [address]    Radiology Studies: No results found.  Scheduled Meds:  vitamin B-12  1,000 mcg Oral Daily   lidocaine  1 patch Transdermal Q24H   Continuous Infusions:  sodium chloride 75 mL/hr at 07/15/22  1121    LOS: 3 days   Raiford Noble, DO [facility] Available via Epic secure chat 7am-7pm After these hours, please refer to coverage provider listed on [email or web address] 07/15/2022, 5:22 PM

## 2022-07-16 DIAGNOSIS — N179 Acute kidney failure, unspecified: Secondary | ICD-10-CM | POA: Diagnosis not present

## 2022-07-16 DIAGNOSIS — T68XXXD Hypothermia, subsequent encounter: Secondary | ICD-10-CM | POA: Diagnosis not present

## 2022-07-16 DIAGNOSIS — G9341 Metabolic encephalopathy: Secondary | ICD-10-CM | POA: Diagnosis not present

## 2022-07-16 DIAGNOSIS — I10 Essential (primary) hypertension: Secondary | ICD-10-CM | POA: Diagnosis not present

## 2022-07-16 LAB — MAGNESIUM: Magnesium: 1.9 mg/dL (ref 1.7–2.4)

## 2022-07-16 LAB — COMPREHENSIVE METABOLIC PANEL
ALT: 17 U/L (ref 0–44)
AST: 34 U/L (ref 15–41)
Albumin: 3.2 g/dL — ABNORMAL LOW (ref 3.5–5.0)
Alkaline Phosphatase: 73 U/L (ref 38–126)
Anion gap: 7 (ref 5–15)
BUN: 16 mg/dL (ref 8–23)
CO2: 25 mmol/L (ref 22–32)
Calcium: 9.2 mg/dL (ref 8.9–10.3)
Chloride: 108 mmol/L (ref 98–111)
Creatinine, Ser: 0.84 mg/dL (ref 0.44–1.00)
GFR, Estimated: >60 mL/min (ref >60)
Glucose, Bld: 71 mg/dL (ref 70–99)
Potassium: 3.7 mmol/L (ref 3.5–5.1)
Sodium: 140 mmol/L (ref 135–145)
Total Bilirubin: 0.7 mg/dL (ref 0.3–1.2)
Total Protein: 6.4 g/dL — ABNORMAL LOW (ref 6.5–8.1)

## 2022-07-16 LAB — CBC WITH DIFFERENTIAL/PLATELET
Abs Immature Granulocytes: 0.01 10*3/uL (ref 0.00–0.07)
Basophils Absolute: 0 10*3/uL (ref 0.0–0.1)
Basophils Relative: 0 %
Eosinophils Absolute: 0.2 10*3/uL (ref 0.0–0.5)
Eosinophils Relative: 4 %
HCT: 35.5 % — ABNORMAL LOW (ref 36.0–46.0)
Hemoglobin: 11.1 g/dL — ABNORMAL LOW (ref 12.0–15.0)
Immature Granulocytes: 0 %
Lymphocytes Relative: 40 %
Lymphs Abs: 1.9 10*3/uL (ref 0.7–4.0)
MCH: 26.6 pg (ref 26.0–34.0)
MCHC: 31.3 g/dL (ref 30.0–36.0)
MCV: 84.9 fL (ref 80.0–100.0)
Monocytes Absolute: 0.6 10*3/uL (ref 0.1–1.0)
Monocytes Relative: 13 %
Neutro Abs: 2 10*3/uL (ref 1.7–7.7)
Neutrophils Relative %: 43 %
Platelets: 198 10*3/uL (ref 150–400)
RBC: 4.18 MIL/uL (ref 3.87–5.11)
RDW: 13.9 % (ref 11.5–15.5)
WBC: 4.7 10*3/uL (ref 4.0–10.5)
nRBC: 0 % (ref 0.0–0.2)

## 2022-07-16 LAB — PHOSPHORUS: Phosphorus: 3.1 mg/dL (ref 2.5–4.6)

## 2022-07-16 NOTE — Consult Note (Signed)
  [name] is a 76 y.o. female with medical history significant of hypertension, breast cancer, unspecified class obesity who was brought to the emergency department via EMS after the patient was confused.  She lives by herself.  She has a son who lives in the area who checks on her.  Neighbors felt that she was confused, but it is unclear who called EMS.  She was found to be hypothermic due to cold exposure.  She stated she went outside only for 30 minutes and was well dressed.  She also stated she is only going to stay because his son asked her to.  She is still disoriented to time, date and situation.  She denied headache, abdominal, back or chest pain at this point.,  No cough or wheezing according to the patient. She stated she still smokes several cigarettes a day.  As stated she has been eating.  No diarrhea or constipation.  Denied urinary symptoms.   Psych consult placed for confusion, hallucinations, wandering, paranoia, agitation.  Attempted to assess patient, however difficult to awake.  Patient would not open her eyes and respond to verbal stimulation and physical stimulation, however showed much difficulty keeping her eyes awake.  Patient has not received any as needed medications since 2 AM this morning.  Psychiatry consult service will continue to follow until assessment is completed and recommendations are made.  -Continue Haldol 2 mg IV push every 6 hours as needed.  EKG obtained on November 30; QTc 440. -Patient at high risk for delirium, recommend initiation of delirium precautions. -Labs reviewed and assessed to include a urine drug screen, TSH, CBC, CMP, B12, iron panel WNL.  B12 level 154, consider B12 supplementation.

## 2022-07-16 NOTE — Care Management Important Message (Signed)
Important Message  Patient Details IM Letter given. Name: [name] MRN: [id number] Date of Birth: [birth year]   Medicare Important Message Given:  Yes     Kerin Salen 07/16/2022, 10:26 AM

## 2022-07-16 NOTE — Progress Notes (Signed)
Mobility Specialist - Progress Note   07/16/22 1419  Mobility  Activity Ambulated with assistance in hallway  Level of Assistance Minimal assist, patient does 75% or more  Assistive Device Front wheel walker  Distance Ambulated (ft) 350 ft  Range of Motion/Exercises Active  Activity Response Tolerated well  Mobility Referral Yes  $Mobility charge 1 Mobility   Pt was found in bed and agreeable to ambulate. Was min-A going from lying to sitting EOB and from sitting to standing. During ambulation was contact guard and had stated "I was not surprised when I woke up in the hospital today". At EOS returned to recliner chair with necessities in reach and chair alarm on. RN notified of session.  Ferd Hibbs Mobility Specialist

## 2022-07-16 NOTE — Progress Notes (Signed)
PROGRESS NOTE    [name]  EXB:[id number] DOB: 10/14/1945 DOA: 07/12/2022 PCP: Velna Hatchet, MD   Brief Narrative:  HPI per Dr. Tennis Must [name] is a 76 y.o. female with medical history significant of hypertension, breast cancer, unspecified class obesity who was brought to the emergency department via EMS after the patient was confused.  She lives by herself.  She has a son who lives in the area who checks on her.  Neighbors felt that she was confused, but it is unclear who called EMS.  She was found to be hypothermic due to cold exposure.  She stated she went outside only for 30 minutes and was well dressed.  She also stated she is only going to stay because his son asked her to.  She is still disoriented to time, date and situation.  She denied headache, abdominal, back or chest pain at this point.,  No cough or wheezing according to the patient. She stated she still smokes several cigarettes a day.  As stated she has been eating.  No diarrhea or constipation.  Denied urinary symptoms.   ED course: Initial vital signs were temperature 90.9 F, pulse 55, respirations 17, BP 137/82 mmHg O2 sat 100% on room air.  The patient received warming measures and 1000 mL old normal saline bolus.  I added LR 1000 mL bolus.   Lab work: Her urinalysis was hazy with ketonuria 5 and proteinuria 30 mg deciliter with rare bacteria microscopic examination.  CBC showed a white count 3.9, hemoglobin 11.6 g/dL platelets 220.  CMP with a creatinine of 1.40 mg/deciliter, but otherwise normal.  Her previous creatinine levels have being under 1.   **Interim History She likely presented with environmental hypothermia we are ruling out infection.  She is improving and COVID testing is negative.     Patient acutely became confused last night and had to be given Haldol and then subsequently started becoming further agitated and wandering into other patient's room.  She continued to have agitation and  combative hairs throughout the shift and nighttime provider was made aware and she was given sedating meds and this morning she was extremely somnolent and drowsy and difficult to arouse.  If she continues to be agitated and have wandering behaviors may need to call psychiatry.  Will continue IV fluid hydration   Throughout the hospitalization patient has had wandering behaviors and hallucinations and agitation.  PT OT evaluated and recommending SNF.  Will consult psychiatric service for her hallucinations, paranoia, wandering behavior as well as agitation.  From medical standpoint she is improved  **The patient was up most of the night and did not sleep very well given her agitation and she continues to hallucinate.  Psychiatry was consulted for further evaluation recommendations however they could not really assess her given that it was difficult for them to arouse given that she cannot keep her eyes awake.  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 -Multiple strokes on previous CT head and active smoking. -She stated she has smoked since her 73s. -Inpatient/PCU admission. -TSH was normal. -Previously low normal B12 level (09/13/2020) and Recheck B12 level still low at 174 -Continue  B12 Supplementation -Urinalysis done and showed a hazy  appearance with amber-colored urine, negative glucose, negative hemoglobin, negative nitrites, negative leukocytes, rare bacteria, 0-5 squamous epithelial cells, 0-5 RBCs per high-power field, and 0-5 WBCs -Obtained CT head and showed "Atrophy and chronic small vessel ischemic changes. Chronic bilateral CVAs. Previous aneurysm repair. No acute intracranial process  identified." -Initiated Haldol given her agitation and confusion -Respiratory virus panel via PCR is negative -Reorient as needed. -Consulted transitional care team. Patient lives at home and there is living concerns due to lack of heat -Since she remains persistently confused we have consulted psychiatry given her wandering behaviors as well as her's extreme agitation overnight and paranoia and hallucinations; overnight she is awake and now sleeping during the day so we need to ensure that delirium precautions are in place as ordered since 12 1 -PT/OT to evaluate and Treat and recommending SNF   Hypothermia -Resolved. -Infection less likely as she is afebrile and has no leukocytosis and has no localizing symptoms   Sinus Bradycardia -Secondary to Hypothermia. -Already resolved. -TSH was 2.024 -Continue monitor on telemetry   AKI (Acute Kidney Injury) (HCC)  Metabolic acidosis -Continued IV fluids. -Hold ARB/ACE. -BUN/creatinine has improved and gone from 23/1.40 -> 25/0.98 -> 22/0.87 -> 18/0.76 and today is now 16/0.84 -Had a slight metabolic acidosis with a CO2 of 20, Anion Gap of 13, chloride level 109 but now this is improved and she has a CO2 of 25, anion gap of 7, chloride level of 108 -Avoid further nephrotoxic medications, contrast dyes, hypotension and dehydration to ensure adequate renal perfusion will need to renally dose medications -Will need to monitor strict input and output and continue monitor renal function carefully and trend electrolytes and repeat CMP in the a.m.   Essential Hypertension -Hold Losartan for now given her hypotension on admission. -Continue to monitor blood pressures per protocol -Last blood pressure reading is now 164/82 today   Agitation and wandering and confusion as well as paranoia -Consult psych for further evaluation of the metabolic issues are being improved and she is somnolent and drowsy and was not able to participate in psychiatric  evaluation.   Normocytic Anemia -Patient's hemoglobin/hematocrit has gone from 11.6/36.1 -> 10.4/32.4 -> 12.6/41.4 -> 11.1/34.9 -> 11.1/35.5 -Anemia panel was checked and showed an iron level of 55, UIBC of 250, TIBC of 305, saturation ratios of 18%, ferritin level 101, folate level 9.9, vitamin B12 level 154 -Supplemented with vitamin B12 once IM yesterday and have now cyanocobalamin today -Continue to monitor for signs and symptoms of bleeding; no overt bleeding noted -Repeat CBC in a.m.   Hypoalbuminemia -Mild. The patient's albumin level went from 3.7 -> 3.1 -> 3.2 -> 3.2 x3 -Continue to monitor and trend and repeat CMP in a.m.   Obesity -Complicates overall prognosis and care -Estimated body mass index is 30.49 kg/m as calculated from the following:   Height as of this encounter: '5\' 5"'$  (1.651 m).   Weight as of this encounter: 83.1 kg.  -Weight Loss and Dietary Counseling given   DVT prophylaxis: SCDs Start: 07/12/22 0803    Code Status: Full Code Family Communication: No family currently at bedside  Disposition Plan:  Level of care: Progressive Status is: Inpatient Remains inpatient appropriate because: She needs further psychiatric evaluation and was more somnolent and drowsy today and did not really want to interact and had her eyes closed the entire time   Consultants:  Psychiatry  Procedures:  As delineated as above  Antimicrobials:  Anti-infectives (From admission, onward)    None  Subjective: Seen and examined at bedside and she was little bit more somnolent and drowsy and had removed her gown and was still very confused and seeing people.  Nursing was in the room when I was doing the exam and nursing states that she was up most of the night.  She has no other concerns or complaints at this time and denies any pain.  Objective: Vitals:   07/16/22 0354 07/16/22 0509 07/16/22 1132 07/16/22 1218  BP: (!) 162/78 (!) 152/72  135/89  Pulse: 60 (!) 57  63   Resp: '20 20  18  '$ Temp: 98.9 F (37.2 C)     TempSrc:      SpO2: 100% 100%  100%  Weight:   83.1 kg   Height:   '5\' 5"'$  (1.651 m)     Intake/Output Summary (Last 24 hours) at 07/16/2022 1758 Last data filed at 07/16/2022 1610 Gross per 24 hour  Intake 577.98 ml  Output 900 ml  Net -322.02 ml   Filed Weights   07/16/22 1132  Weight: 83.1 kg   Examination: Physical Exam:  Constitutional: WN/WD obese elderly African-American female who is a little somnolent and drowsy and has her eyes closed during the entire encounter and does not open them to speak Respiratory: Diminished to auscultation bilaterally, no wheezing, rales, rhonchi or crackles. Normal respiratory effort and patient is not tachypenic. No accessory muscle use.  Unlabored breathing Cardiovascular: RRR, no murmurs / rubs / gallops. S1 and S2 auscultated.  Abdomen: Soft, non-tender, distended secondary to body habitus. Bowel sounds positive.  GU: Deferred. Musculoskeletal: No clubbing / cyanosis of digits/nails. No joint deformity upper and lower extremities.  Skin: No rashes, lesions, ulcers on limited skin evaluation but does have a surgical scar from her right mastectomy noted. No induration; Warm and dry.  Neurologic: She is somnolent and drowsy and is keeping her eyes closed on during entire encounter and does answer some questions. Psychiatric: Impaired judgment and insight.  Has been agitated and wandering previously  Data Reviewed: I have personally reviewed following labs and imaging studies  CBC: Recent Labs  Lab 07/12/22 0115 07/13/22 0413 07/14/22 1904 07/15/22 0640 07/16/22 0346  WBC 3.9* 4.6 3.4* 4.3 4.7  NEUTROABS 2.2  --   --  1.7 2.0  HGB 11.6* 10.4* 12.6 11.1* 11.1*  HCT 36.1 32.4* 41.4 34.9* 35.5*  MCV 83.6 83.7 88.8 85.5 84.9  PLT 220 205 174 197 960   Basic Metabolic Panel: Recent Labs  Lab 07/12/22 0115 07/13/22 0413 07/14/22 1904 07/15/22 0349 07/16/22 0346  NA 138 142 141 142 140   K 4.0 3.5 3.7 4.4 3.7  CL 103 110 108 109 108  CO2 '25 23 24 '$ 20* 25  GLUCOSE 98 71 104* 65* 71  BUN 23 25* '22 18 16  '$ CREATININE 1.40* 0.98 0.87 0.76 0.84  CALCIUM 9.3 8.7* 9.0 9.2 9.2  MG  --   --  1.6* 1.7 1.9  PHOS  --   --  3.4 3.6 3.1   GFR: Estimated Creatinine Clearance: 62.5 mL/min (by C-G formula based on SCr of 0.84 mg/dL). Liver Function Tests: Recent Labs  Lab 07/12/22 0115 07/13/22 0413 07/14/22 1904 07/15/22 0349 07/16/22 0346  AST 32 32 39 37 34  ALT '18 15 21 17 17  '$ ALKPHOS 83 67 70 73 73  BILITOT 0.7 0.7 0.7 0.8 0.7  PROT 7.3 6.2* 6.3* 6.3* 6.4*  ALBUMIN 3.7 3.1* 3.2* 3.2* 3.2*   No results for input(s): "LIPASE", "  AMYLASE" in the last 168 hours. No results for input(s): "AMMONIA" in the last 168 hours. Coagulation Profile: No results for input(s): "INR", "PROTIME" in the last 168 hours. Cardiac Enzymes: No results for input(s): "CKTOTAL", "CKMB", "CKMBINDEX", "TROPONINI" in the last 168 hours. BNP (last 3 results) No results for input(s): "PROBNP" in the last 8760 hours. HbA1C: No results for input(s): "HGBA1C" in the last 72 hours. CBG: Recent Labs  Lab 07/12/22 0046  GLUCAP 105*   Lipid Profile: No results for input(s): "CHOL", "HDL", "LDLCALC", "TRIG", "CHOLHDL", "LDLDIRECT" in the last 72 hours. Thyroid Function Tests: No results for input(s): "TSH", "T4TOTAL", "FREET4", "T3FREE", "THYROIDAB" in the last 72 hours. Anemia Panel: Recent Labs    07/14/22 0424 07/14/22 1904  VITAMINB12 154*  --   FOLATE 9.9  --   FERRITIN 101  --   TIBC 305  --   IRON 55  --   RETICCTPCT  --  1.4   Sepsis Labs: No results for input(s): "PROCALCITON", "LATICACIDVEN" in the last 168 hours.  Recent Results (from the past 240 hour(s))  SARS Coronavirus 2 by RT PCR (hospital order, performed in [facility] hospital lab) *cepheid single result test* Anterior Nasal Swab     Status: None   Collection Time: 07/12/22  6:40 AM   Specimen: Anterior Nasal Swab   Result Value Ref Range Status   SARS Coronavirus 2 by RT PCR NEGATIVE NEGATIVE Final    Comment: (NOTE) SARS-CoV-2 target nucleic acids are NOT DETECTED.  The SARS-CoV-2 RNA is generally detectable in upper and lower respiratory specimens during the acute phase of infection. The lowest concentration of SARS-CoV-2 viral copies this assay can detect is 250 copies / mL. A negative result does not preclude SARS-CoV-2 infection and should not be used as the sole basis for treatment or other patient management decisions.  A negative result may occur with improper specimen collection / handling, submission of specimen other than nasopharyngeal swab, presence of viral mutation(s) within the areas targeted by this assay, and inadequate number of viral copies (<250 copies / mL). A negative result must be combined with clinical observations, patient history, and epidemiological information.  Fact Sheet for Patients:   https://www.patel.info/  Fact Sheet for Healthcare Providers: https://hall.com/  This test is not yet approved or  cleared by the [place] FDA and has been authorized for detection and/or diagnosis of SARS-CoV-2 by FDA under an Emergency Use Authorization (EUA).  This EUA will remain in effect (meaning this test can be used) for the duration of the COVID-19 declaration under Section 564(b)(1) of the Act, 21 U.S.C. section 360bbb-3(b)(1), unless the authorization is terminated or revoked sooner.  Performed at [facility], [place] [address], [place], [place] [address]   Respiratory (~20 pathogens) panel by PCR     Status: None   Collection Time: 07/12/22  6:40 AM   Specimen: Anterior Nasal Swab; Respiratory  Result Value Ref Range Status   Adenovirus NOT DETECTED NOT DETECTED Final   Coronavirus 229E NOT DETECTED NOT DETECTED Final    Comment: (NOTE) The Coronavirus on the Respiratory Panel, DOES NOT test for  the novel  Coronavirus (2019 nCoV)    Coronavirus HKU1 NOT DETECTED NOT DETECTED Final   Coronavirus NL63 NOT DETECTED NOT DETECTED Final   Coronavirus OC43 NOT DETECTED NOT DETECTED Final   Metapneumovirus NOT DETECTED NOT DETECTED Final   Rhinovirus / Enterovirus NOT DETECTED NOT DETECTED Final   Influenza A NOT DETECTED NOT DETECTED Final  Influenza B NOT DETECTED NOT DETECTED Final   Parainfluenza Virus 1 NOT DETECTED NOT DETECTED Final   Parainfluenza Virus 2 NOT DETECTED NOT DETECTED Final   Parainfluenza Virus 3 NOT DETECTED NOT DETECTED Final   Parainfluenza Virus 4 NOT DETECTED NOT DETECTED Final   Respiratory Syncytial Virus NOT DETECTED NOT DETECTED Final   Bordetella pertussis NOT DETECTED NOT DETECTED Final   Bordetella Parapertussis NOT DETECTED NOT DETECTED Final   Chlamydophila pneumoniae NOT DETECTED NOT DETECTED Final   Mycoplasma pneumoniae NOT DETECTED NOT DETECTED Final    Comment: Performed at [facility], [place] [address] [place], [place] [address]    Radiology Studies: No results found.  Scheduled Meds:  vitamin B-12  1,000 mcg Oral Daily   lidocaine  1 patch Transdermal Q24H   Continuous Infusions:   LOS: 4 days   Raiford Noble, DO [facility] Available via Epic secure chat 7am-7pm After these hours, please refer to coverage provider listed on [email or web address] 07/16/2022, 5:58 PM

## 2022-07-17 DIAGNOSIS — N179 Acute kidney failure, unspecified: Secondary | ICD-10-CM | POA: Diagnosis not present

## 2022-07-17 DIAGNOSIS — R410 Disorientation, unspecified: Secondary | ICD-10-CM

## 2022-07-17 DIAGNOSIS — T68XXXD Hypothermia, subsequent encounter: Secondary | ICD-10-CM | POA: Diagnosis not present

## 2022-07-17 DIAGNOSIS — I10 Essential (primary) hypertension: Secondary | ICD-10-CM | POA: Diagnosis not present

## 2022-07-17 DIAGNOSIS — G9341 Metabolic encephalopathy: Secondary | ICD-10-CM | POA: Diagnosis not present

## 2022-07-17 LAB — CBC WITH DIFFERENTIAL/PLATELET
Abs Immature Granulocytes: 0.01 10*3/uL (ref 0.00–0.07)
Basophils Absolute: 0 10*3/uL (ref 0.0–0.1)
Basophils Relative: 1 %
Eosinophils Absolute: 0.2 10*3/uL (ref 0.0–0.5)
Eosinophils Relative: 5 %
HCT: 32.7 % — ABNORMAL LOW (ref 36.0–46.0)
Hemoglobin: 10.6 g/dL — ABNORMAL LOW (ref 12.0–15.0)
Immature Granulocytes: 0 %
Lymphocytes Relative: 50 %
Lymphs Abs: 2 10*3/uL (ref 0.7–4.0)
MCH: 26.8 pg (ref 26.0–34.0)
MCHC: 32.4 g/dL (ref 30.0–36.0)
MCV: 82.6 fL (ref 80.0–100.0)
Monocytes Absolute: 0.5 10*3/uL (ref 0.1–1.0)
Monocytes Relative: 12 %
Neutro Abs: 1.3 10*3/uL — ABNORMAL LOW (ref 1.7–7.7)
Neutrophils Relative %: 32 %
Platelets: 195 10*3/uL (ref 150–400)
RBC: 3.96 MIL/uL (ref 3.87–5.11)
RDW: 13.8 % (ref 11.5–15.5)
WBC: 4 10*3/uL (ref 4.0–10.5)
nRBC: 0 % (ref 0.0–0.2)

## 2022-07-17 LAB — COMPREHENSIVE METABOLIC PANEL
ALT: 19 U/L (ref 0–44)
AST: 32 U/L (ref 15–41)
Albumin: 3.1 g/dL — ABNORMAL LOW (ref 3.5–5.0)
Alkaline Phosphatase: 74 U/L (ref 38–126)
Anion gap: 6 (ref 5–15)
BUN: 19 mg/dL (ref 8–23)
CO2: 26 mmol/L (ref 22–32)
Calcium: 8.9 mg/dL (ref 8.9–10.3)
Chloride: 109 mmol/L (ref 98–111)
Creatinine, Ser: 0.82 mg/dL (ref 0.44–1.00)
GFR, Estimated: >60 mL/min (ref >60)
Glucose, Bld: 66 mg/dL — ABNORMAL LOW (ref 70–99)
Potassium: 3.8 mmol/L (ref 3.5–5.1)
Sodium: 141 mmol/L (ref 135–145)
Total Bilirubin: 0.4 mg/dL (ref 0.3–1.2)
Total Protein: 6.3 g/dL — ABNORMAL LOW (ref 6.5–8.1)

## 2022-07-17 LAB — GLUCOSE, CAPILLARY: Glucose-Capillary: 107 mg/dL — ABNORMAL HIGH (ref 70–99)

## 2022-07-17 LAB — PHOSPHORUS: Phosphorus: 4.3 mg/dL (ref 2.5–4.6)

## 2022-07-17 LAB — MAGNESIUM: Magnesium: 1.8 mg/dL (ref 1.7–2.4)

## 2022-07-17 NOTE — TOC Progression Note (Signed)
Transition of Care [facility]) - Progression Note    Patient Details  Name: [name] MRN: [id number] Date of Birth: November 21, 1945  Transition of [facility]) CM/SW Walker, LCSW Phone Number: 07/17/2022, 10:45 AM  Clinical Narrative:     Due to pt's disorientation, CSW called and spoke with [name] ([phone]) to inform him about PT's recommendation for skilled nursing. Pt's son reported pt was placed in [facility] in the past. Pt's son stated he would like for pt to return to [facility]. CSW informed the son of the placement process. Pt's son wishes to be informed about pt's care. CSW to complete FL2 and send pt's information out. TOC to follow.    Expected Discharge Plan: Home/Self Care Barriers to Discharge: Continued Medical Work up  Expected Discharge Plan and Services Expected Discharge Plan: Home/Self Care   Discharge Planning Services: CM Consult   Living arrangements for the past 2 months: Single Family Home                                       Social Determinants of Health (SDOH) Interventions Housing Interventions: Inpatient TOC (Pt son reports pt cannot afford her gas, and this time of year, her hypothermia is always a case when temperature drops because of this. Inquired after assistance.)  Readmission Risk Interventions     No data to display

## 2022-07-17 NOTE — Progress Notes (Signed)
PROGRESS NOTE    [name]  YNW:[id number] DOB: 1946-04-21 DOA: 07/12/2022 PCP: Velna Hatchet, MD   Brief Narrative:  HPI per Dr. Tennis Must [name] is a [name] with medical history significant of hypertension, breast cancer, unspecified class obesity who was brought to the emergency department via EMS after the patient was confused.  [name] lives by herself.  [name] has a son who lives in the area who checks on her.  Neighbors felt that [name] was confused, but it is unclear who called EMS.  [name] was found to be hypothermic due to cold exposure.  [name] stated [name] went outside only for 30 minutes and was well dressed.  [name] also stated [name] is only going to stay because his son asked her to.  [name] is still disoriented to time, date and situation.  [name] denied headache, abdominal, back or chest pain at this point.,  No cough or wheezing according to the patient. [name] stated [name] still smokes several cigarettes a day.  As stated [name] has been eating.  No diarrhea or constipation.  Denied urinary symptoms.   ED course: Initial vital signs were temperature 90.9 F, pulse 55, respirations 17, BP 137/82 mmHg O2 sat 100% on room air.  The patient received warming measures and 1000 mL old normal saline bolus.  I added LR 1000 mL bolus.   Lab work: Her urinalysis was hazy with ketonuria 5 and proteinuria 30 mg deciliter with rare bacteria microscopic examination.  CBC showed a white count 3.9, hemoglobin 11.6 g/dL platelets 220.  CMP with a creatinine of 1.40 mg/deciliter, but otherwise normal.  Her previous creatinine levels have being under 1.   **Interim History [name] likely presented with environmental hypothermia we are ruling out infection.  [name] is improving and COVID testing is negative.     Patient acutely became confused last night and had to be given Haldol and then subsequently started becoming further agitated and wandering into other patient's room.  [name] continued to have agitation and  combative hairs throughout the shift and nighttime provider was made aware and [name] was given sedating meds and this morning [name] was extremely somnolent and drowsy and difficult to arouse.  If [name] continues to be agitated and have wandering behaviors may need to call psychiatry.  Will continue IV fluid hydration   Throughout the hospitalization patient has had wandering behaviors and hallucinations and agitation.  PT OT evaluated and recommending SNF.  Will consult psychiatric service for her hallucinations, paranoia, wandering behavior as well as agitation.  From medical standpoint [name] is improved   **12/3-12/4: the patient was up most of the night and did not sleep very well given her agitation and [name] continues to hallucinate.  Psychiatry was consulted for further evaluation recommendations however they could not really assess her given that it was difficult for them to arouse given that [name] cannot keep her eyes awake.  Psych recommends continue Haldol 2 mg IV push every 6 hours as needed and placed on delirium precautions and will follow-up when [name] is more awake and assessment can be made  12/5: Still little somnolent but easily arousable and was answering questions appropriately but continues to hallucinate and thinks that [name] is "seeing her aunt and her aunt is talking to her".  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 -Multiple strokes on previous CT head and  active smoking. -[name] stated [name] has smoked since her 90s. -Inpatient/PCU admission. -TSH was normal. -Previously low normal B12 level (09/13/2020) and Recheck B12 level still low at 174 -Continue  B12 Supplementation -Urinalysis done and showed a hazy appearance with amber-colored urine, negative glucose, negative hemoglobin, negative nitrites, negative leukocytes, rare  bacteria, 0-5 squamous epithelial cells, 0-5 RBCs per high-power field, and 0-5 WBCs -Obtained CT head and showed "Atrophy and chronic small vessel ischemic changes. Chronic bilateral CVAs. Previous aneurysm repair. No acute intracranial process identified." -Initiated Haldol given her agitation and confusion -Respiratory virus panel via PCR is negative -Reorient as needed. -Consulted transitional care team. Patient lives at home and there is living concerns due to lack of heat -Since [name] remains persistently confused we have consulted psychiatry given her wandering behaviors as well as her's extreme agitation overnight and paranoia and hallucinations; overnight [name] is awake and now sleeping during the day so we need to ensure that delirium precautions are in place as ordered since 12/1 -Appreciate psychiatric evaluation and if psychiatry clears the patient [name] will need outpatient neurology evaluation for dementia workup -PT/OT to evaluate and Treat and recommending SNF   Hypothermia -Resolved. -Infection less likely as [name] is afebrile and has no leukocytosis and has no localizing symptoms   Sinus Bradycardia -Secondary to Hypothermia. -Already resolved. -TSH was 2.024 -Continue monitor on telemetry   AKI (Acute Kidney Injury) ([place])  Metabolic acidosis -Continued IV fluids. -Hold ARB/ACE. -BUN/creatinine has improved and gone from 23/1.40 -> 25/0.98 -> 22/0.87 -> 18/0.76 -> 16/0.84 -> 19/0.82 -Had a slight metabolic acidosis with a CO2 of 20, Anion Gap of 13, chloride level 109 but now this is improved and [name] has a CO2 of 26, anion gap of 6, chloride level of 109 -Avoid further nephrotoxic medications, contrast dyes, hypotension and dehydration to ensure adequate renal perfusion will need to renally dose medications -Will need to monitor strict input and output and continue monitor renal function carefully and trend electrolytes and repeat CMP in the a.m.   Essential  Hypertension -Hold Losartan for now given her hypotension on admission. -Continue to monitor blood pressures per protocol -Last blood pressure reading is now 133/63 today   Agitation and wandering and confusion as well as paranoia -Consult psych for further evaluation of the metabolic issues are being improved and [name] is somnolent and drowsy and was not able to participate in psychiatric evaluation.  [name] is still following this morning but is a bit more awake and alert today and should be able to participate.  Still hallucinating and seeing her aunt at bedside   Normocytic Anemia -Patient's hemoglobin/hematocrit has gone from 11.6/36.1 -> 10.4/32.4 -> 12.6/41.4 -> 11.1/34.9 -> 11.1/35.5 -> 10.6/32.7 -Anemia panel was checked and showed an iron level of 55, UIBC of 250, TIBC of 305, saturation ratios of 18%, ferritin level 101, folate level 9.9, vitamin B12 level 154 -Supplemented with vitamin B12 once IM yesterday and have now cyanocobalamin today -Continue to monitor for signs and symptoms of bleeding; no overt bleeding noted -Repeat CBC in a.m.   Hypoalbuminemia -Mild. The patient's albumin level went from 3.7 -> 3.1 -> 3.2 -> 3.2 x3 -> 3.1 -Continue to monitor and trend and repeat CMP in a.m.   Obesity -Complicates overall prognosis and care -Estimated body mass index is 30.49 kg/m as calculated from the following:   Height as of this encounter: '5\' 5"'$  (1.651 m).   Weight as of this encounter: 83.1 kg.  -Weight Loss and Dietary  Counseling given  DVT prophylaxis: SCDs Start: 07/12/22 0803    Code Status: Full Code Family Communication: No family currently at bedside  Disposition Plan:  Level of care: Progressive Status is: Inpatient Remains inpatient appropriate because: Needs further clinical improvement and evaluation by psychiatry as well as a safe discharge disposition to SNF as PT OT recommends this   Consultants:  Psychiatry  Procedures:  As delineated as  above  Antimicrobials:  Anti-infectives (From admission, onward)    None       Subjective: [name] was seen and examined at bedside and was little somnolent again but easily arousable and still hallucinating.  Denied any pain.  No nausea or vomiting.  Denies any chest pain or shortness of breath.  Objective: Vitals:   07/16/22 2221 07/17/22 0042 07/17/22 0451 07/17/22 1345  BP: (!) 143/Adriana  139/73 133/63  Pulse: 66  70 71  Resp:   16 16  Temp: (!) 95 F (35 C) (!) 97.5 F (36.4 C) (!) 97.5 F (36.4 C) 97.8 F (36.6 C)  TempSrc: Rectal Oral Oral Oral  SpO2:   97% 98%  Weight:      Height:        Intake/Output Summary (Last 24 hours) at 07/17/2022 1517 Last data filed at 07/17/2022 0600 Gross per 24 hour  Intake 240 ml  Output 600 ml  Net -360 ml   Filed Weights   07/16/22 1132  Weight: 83.1 kg   Examination: Physical Exam:  Constitutional: WN/WD obese African-American Navarro in no acute distress this is a bit more somnolent and drowsy history of easily arousable Respiratory: Diminished to auscultation bilaterally, no wheezing, rales, rhonchi or crackles. Normal respiratory effort and patient is not tachypenic. No accessory muscle use.  Unlabored breathing Cardiovascular: RRR, no murmurs / rubs / gallops. S1 and S2 auscultated.  Abdomen: Soft, non-tender, distended secondary to body habitus.  Bowel sounds positive.  GU: Deferred. Musculoskeletal: No clubbing / cyanosis of digits/nails. No joint deformity upper and lower extremities. .  Skin: No rashes, lesions, ulcers on limited skin evaluation. No induration; Warm and dry.  Neurologic: Alert somnolent and drowsy but very easily arousable and still hallucinating Psychiatric: Impaired judgment and insight.  Somnolent and drowsy  Data Reviewed: I have personally reviewed following labs and imaging studies  CBC: Recent Labs  Lab 07/12/22 0115 07/13/22 0413 07/14/22 1904 07/15/22 0640 07/16/22 0346 07/17/22 0414   WBC 3.9* 4.6 3.4* 4.3 4.7 4.0  NEUTROABS 2.2  --   --  1.7 2.0 1.3*  HGB 11.6* 10.4* 12.6 11.1* 11.1* 10.6*  HCT 36.1 32.4* 41.4 34.9* 35.5* 32.7*  MCV 83.6 83.7 88.8 85.5 84.9 82.6  PLT 220 205 174 197 198 086   Basic Metabolic Panel: Recent Labs  Lab 07/13/22 0413 07/14/22 1904 07/15/22 0349 07/16/22 0346 07/17/22 0414  NA 142 141 142 140 141  K 3.5 3.7 4.4 3.7 3.8  CL 110 108 109 108 109  CO2 23 24 20* 25 26  GLUCOSE 71 104* 65* 71 66*  BUN 25* '22 18 16 19  '$ CREATININE 0.98 0.87 0.76 0.84 0.82  CALCIUM 8.7* 9.0 9.2 9.2 8.9  MG  --  1.6* 1.7 1.9 1.8  PHOS  --  3.4 3.6 3.1 4.3   GFR: Estimated Creatinine Clearance: 64 mL/min (by C-G formula based on SCr of 0.82 mg/dL). Liver Function Tests: Recent Labs  Lab 07/13/22 0413 07/14/22 1904 07/15/22 0349 07/16/22 0346 07/17/22 0414  AST 32 39 37 34 32  ALT '15 21 17 17 19  '$ ALKPHOS 67 70 73 73 Adriana  BILITOT 0.7 0.7 0.8 0.7 0.4  PROT 6.2* 6.3* 6.3* 6.4* 6.3*  ALBUMIN 3.1* 3.2* 3.2* 3.2* 3.1*   No results for input(s): "LIPASE", "AMYLASE" in the last 168 hours. No results for input(s): "AMMONIA" in the last 168 hours. Coagulation Profile: No results for input(s): "INR", "PROTIME" in the last 168 hours. Cardiac Enzymes: No results for input(s): "CKTOTAL", "CKMB", "CKMBINDEX", "TROPONINI" in the last 168 hours. BNP (last 3 results) No results for input(s): "PROBNP" in the last 8760 hours. HbA1C: No results for input(s): "HGBA1C" in the last 72 hours. CBG: Recent Labs  Lab 07/12/22 0046 07/17/22 0615  GLUCAP 105* 107*   Lipid Profile: No results for input(s): "CHOL", "HDL", "LDLCALC", "TRIG", "CHOLHDL", "LDLDIRECT" in the last 72 hours. Thyroid Function Tests: No results for input(s): "TSH", "T4TOTAL", "FREET4", "T3FREE", "THYROIDAB" in the last 72 hours. Anemia Panel: Recent Labs    07/14/22 1904  RETICCTPCT 1.4   Sepsis Labs: No results for input(s): "PROCALCITON", "LATICACIDVEN" in the last 168  hours.  Recent Results (from the past 240 hour(s))  SARS Coronavirus 2 by RT PCR (hospital order, performed in [facility] hospital lab) *cepheid single result test* Anterior Nasal Swab     Status: None   Collection Time: 07/12/22  6:40 AM   Specimen: Anterior Nasal Swab  Result Value Ref Range Status   SARS Coronavirus 2 by RT PCR NEGATIVE NEGATIVE Final    Comment: (NOTE) SARS-CoV-2 target nucleic acids are NOT DETECTED.  The SARS-CoV-2 RNA is generally detectable in upper and lower respiratory specimens during the acute phase of infection. The lowest concentration of SARS-CoV-2 viral copies this assay can detect is 250 copies / mL. A negative result does not preclude SARS-CoV-2 infection and should not be used as the sole basis for treatment or other patient management decisions.  A negative result may occur with improper specimen collection / handling, submission of specimen other than nasopharyngeal swab, presence of viral mutation(s) within the areas targeted by this assay, and inadequate number of viral copies (<250 copies / mL). A negative result must be combined with clinical observations, patient history, and epidemiological information.  Fact Sheet for Patients:   https://www.patel.info/  Fact Sheet for Healthcare Providers: https://hall.com/  This test is not yet approved or  cleared by the [place] FDA and has been authorized for detection and/or diagnosis of SARS-CoV-2 by FDA under an Emergency Use Authorization (EUA).  This EUA will remain in effect (meaning this test can be used) for the duration of the COVID-19 declaration under Section 564(b)(1) of the Act, 21 U.S.C. section 360bbb-3(b)(1), unless the authorization is terminated or revoked sooner.  Performed at [facility], [place] [address], [place], [place] [address]   Respiratory (~20 pathogens) panel by PCR     Status: None   Collection  Time: 07/12/22  6:40 AM   Specimen: Anterior Nasal Swab; Respiratory  Result Value Ref Range Status   Adenovirus NOT DETECTED NOT DETECTED Final   Coronavirus 229E NOT DETECTED NOT DETECTED Final    Comment: (NOTE) The Coronavirus on the Respiratory Panel, DOES NOT test for the novel  Coronavirus (2019 nCoV)    Coronavirus HKU1 NOT DETECTED NOT DETECTED Final   Coronavirus NL63 NOT DETECTED NOT DETECTED Final   Coronavirus OC43 NOT DETECTED NOT DETECTED Final   Metapneumovirus NOT DETECTED NOT DETECTED Final   Rhinovirus / Enterovirus NOT DETECTED NOT DETECTED Final  Influenza A NOT DETECTED NOT DETECTED Final   Influenza B NOT DETECTED NOT DETECTED Final   Parainfluenza Virus 1 NOT DETECTED NOT DETECTED Final   Parainfluenza Virus 2 NOT DETECTED NOT DETECTED Final   Parainfluenza Virus 3 NOT DETECTED NOT DETECTED Final   Parainfluenza Virus 4 NOT DETECTED NOT DETECTED Final   Respiratory Syncytial Virus NOT DETECTED NOT DETECTED Final   Bordetella pertussis NOT DETECTED NOT DETECTED Final   Bordetella Parapertussis NOT DETECTED NOT DETECTED Final   Chlamydophila pneumoniae NOT DETECTED NOT DETECTED Final   Mycoplasma pneumoniae NOT DETECTED NOT DETECTED Final    Comment: Performed at [facility], [place] [address] [place], [place] [address]    Radiology Studies: No results found.  Scheduled Meds:  vitamin B-12  1,000 mcg Oral Daily   lidocaine  1 patch Transdermal Q24H   Continuous Infusions:   LOS: 5 days   Raiford Noble, DO [facility] Available via Epic secure chat 7am-7pm After these hours, please refer to coverage provider listed on [email or web address] 07/17/2022, 3:17 PM

## 2022-07-17 NOTE — Consult Note (Signed)
[facility] Consult   Reason for Consult: confusion, Hallucination, wandering, paranoia, agitation  Referring Physician:  Dr. Alfredia Ferguson Patient Identification: [name] MRN:  [id number] Principal Diagnosis: Acute metabolic encephalopathy Diagnosis:  Principal Problem:   Acute metabolic encephalopathy Active Problems:   Hypothermia   Sinus bradycardia   Essential hypertension   Normocytic anemia   AKI (acute kidney injury) ([place])   Total Time spent with patient: 1 hour  Subjective:   [name] is a 76 y.o. female patient admitted with confusion..  Patient seen and chart reviewed-patient is interviewed this afternoon, no family present.  Patient is an overall good historian and is able to provide answers to most history questions she provides additional information about history and her current environment. Patient is oriented to self and hospital and is somewhat child-like on interview..  She is not oriented to city, situation, year or month. Patient describes her mood as "good".  Patient reports sleeping well yesterday but indicates that overall she does not have a prior sleeping.  Patient is unable to for much information as to what brought her in the hospital with the exception of, "my son thought I was sick and needed to come into the hospital."  She does report increasing episodes of confusion and forgetfulness, however states" it usually comes back to me within a matter of time. "  She denies any previous evaluation for cognitive impairment and for his information to include  "I have had several evaluations and each time I am fine.  "  Patient does state that her mother has a history of dementia, died at 75.  Patient denies SI/HI/visual hallucinations.  Patient denies current auditory hallucinations, delusions, psychosis, paranoia.  She also denies any wandering behavior, despite reports received from nursing staff of patient entering other patient's rooms.  She does  report feeling safe while in the hospital, and denies any safety concerns at this time.  She provides consent to speak with her son who is her primary caretaker.     HPI: [name] is a 76 y.o. female with medical history significant of hypertension, breast cancer, unspecified class obesity who was brought to the emergency department via EMS after the patient was confused.  She lives by herself.  She has a son who lives in the area who checks on her.  Neighbors felt that she was confused, but it is unclear who called EMS.  She was found to be hypothermic due to cold exposure.  She stated she went outside only for 30 minutes and was well dressed.  She also stated she is only going to stay because his son asked her to.  She is still disoriented to time, date and situation.  She denied headache, abdominal, back or chest pain at this point.,  No cough or wheezing according to the patient. She stated she still smokes several cigarettes a day.  As stated she has been eating.  No diarrhea or constipation.  Denied urinary symptoms.     Collateral from son Chantrice Hagg: Patient's son reports that a similar episode like this happened 1-1/2 years ago.  He reports an increase in confusion, during cold temperatures.  He reports that her current home is heated off of gas, and tends to be very cold and difficult to heat.  His concern is his mother is currently on disability, and she is in need of resources to help pay for her gas bill and proper heating.  He denies any acute concerns  and or safety concerns.  He reports that his mother likely will not go outside and wander, " because she does not like the cold.  I know that will not happen.  My mother hates the cold."  He further denies any substance history, alcohol use, and or caffeine.  He does endorse history of nicotine reporting she smokes 1 pack of cigarettes every month.  He denies any additional concerns at this time.  Education provided surrounding evaluation  and slowing progression of the disease. Will encourage family to pursue further evaluation by a neuropsychiatrist or neurologist, who can diagnose, treat, slow progression of cognitive impairment/dementia.  The above information and processes, is best to determine in an outpatient setting.  Also discussed with son the need for possible long-term care facility, assisted living, and or memory care unit where persons are better equipped to manage the progression of this disease.  He is adamant that his mother will remain in the home, and will entertain home health.  Discussed with son while we are not currently at the need for 24/7 coverage, it is important to seek outpatient evaluation to properly align for services.    Past Psychiatric History: Pt denies ever been hospitalized for mental health concerns in the past. Denies any previous history of suicidal thoughts, suicidal ideations, and or non suicidal self injurious behaviors. Pt denies history of aggression, agitation, violent behavior, and or history of homicidal ideations/thoughts.  Patient further denies any current, previous legal charges.  Patient further denies access to guns, weapons, or any engagement with the legal system.  Patient denies history of illicit substances to include synthetic substances, any cannabidiol, supplemental herbs.    Risk to Self:  Denies Risk to Others:  Denies Prior Inpatient Therapy: Denies  Prior Outpatient Therapy: Denies  Past Medical History:  Past Medical History:  Diagnosis Date   History of breast cancer    20 YEARS AGO   Hypertension     Past Surgical History:  Procedure Laterality Date   COLONOSCOPY WITH PROPOFOL N/A 12/29/2021   Procedure: COLONOSCOPY WITH PROPOFOL;  Surgeon: Carol Ada, MD;  Location: [facility];  Service: Gastroenterology;  Laterality: N/A;   MASTECTOMY     POLYPECTOMY  12/29/2021   Procedure: POLYPECTOMY;  Surgeon: Carol Ada, MD;  Location: [facility];  Service:  Gastroenterology;;   Family History:  Family History  Problem Relation Age of Onset   Heart attack Mother    Heart failure Father    Family Psychiatric  History:  Social History:  Social History   Substance and Sexual Activity  Alcohol Use Not Currently   Alcohol/week: 0.0 standard drinks of alcohol     Social History   Substance and Sexual Activity  Drug Use Not Currently    Social History   Socioeconomic History   Marital status: Widowed    Spouse name: Not on file   Number of children: Not on file   Years of education: Not on file   Highest education level: Not on file  Occupational History   Not on file  Tobacco Use   Smoking status: Former    Packs/day: 0.40    Years: 20.00    Total pack years: 8.00    Types: Cigarettes    Quit date: 08/13/2020    Years since quitting: 1.9   Smokeless tobacco: Never  Vaping Use   Vaping Use: Never used  Substance and Sexual Activity   Alcohol use: Not Currently    Alcohol/week: 0.0 standard drinks  of alcohol   Drug use: Not Currently   Sexual activity: Not Currently  Other Topics Concern   Not on file  Social History Narrative   Not on file   Social Determinants of Health   Financial Resource Strain: Not on file  Food Insecurity: No Food Insecurity (07/12/2022)   Hunger Vital Sign    Worried About Running Out of Food in the Last Year: Never true    Ran Out of Food in the Last Year: Never true  Transportation Needs: No Transportation Needs (07/12/2022)   PRAPARE - Hydrologist (Medical): No    Lack of Transportation (Non-Medical): No  Physical Activity: Not on file  Stress: Not on file  Social Connections: Not on file   Additional Social History:    Allergies:   Allergies  Allergen Reactions   Shellfish Allergy Hives and Shortness Of Breath    Labs:  Results for orders placed or performed during the hospital encounter of 07/12/22 (from the past 48 hour(s))  CBC with  Differential/Platelet     Status: Abnormal   Collection Time: 07/16/22  3:46 AM  Result Value Ref Range   WBC 4.7 4.0 - 10.5 K/uL   RBC 4.18 3.87 - 5.11 MIL/uL   Hemoglobin 11.1 (L) 12.0 - 15.0 g/dL   HCT 35.5 (L) 36.0 - 46.0 %   MCV 84.9 80.0 - 100.0 fL   MCH 26.6 26.0 - 34.0 pg   MCHC 31.3 30.0 - 36.0 g/dL   RDW 13.9 11.5 - 15.5 %   Platelets 198 150 - 400 K/uL   nRBC 0.0 0.0 - 0.2 %   Neutrophils Relative % 43 %   Neutro Abs 2.0 1.7 - 7.7 K/uL   Lymphocytes Relative 40 %   Lymphs Abs 1.9 0.7 - 4.0 K/uL   Monocytes Relative 13 %   Monocytes Absolute 0.6 0.1 - 1.0 K/uL   Eosinophils Relative 4 %   Eosinophils Absolute 0.2 0.0 - 0.5 K/uL   Basophils Relative 0 %   Basophils Absolute 0.0 0.0 - 0.1 K/uL   Immature Granulocytes 0 %   Abs Immature Granulocytes 0.01 0.00 - 0.07 K/uL    Comment: Performed at [facility], [place] [address], [place], [place] [address]  Comprehensive metabolic panel     Status: Abnormal   Collection Time: 07/16/22  3:46 AM  Result Value Ref Range   Sodium 140 135 - 145 mmol/L   Potassium 3.7 3.5 - 5.1 mmol/L   Chloride 108 98 - 111 mmol/L   CO2 25 22 - 32 mmol/L   Glucose, Bld 71 70 - 99 mg/dL    Comment: Glucose reference range applies only to samples taken after fasting for at least 8 hours.   BUN 16 8 - 23 mg/dL   Creatinine, Ser 0.84 0.44 - 1.00 mg/dL   Calcium 9.2 8.9 - 10.3 mg/dL   Total Protein 6.4 (L) 6.5 - 8.1 g/dL   Albumin 3.2 (L) 3.5 - 5.0 g/dL   AST 34 15 - 41 U/L   ALT 17 0 - 44 U/L   Alkaline Phosphatase 73 38 - 126 U/L   Total Bilirubin 0.7 0.3 - 1.2 mg/dL   GFR, Estimated >60 >60 mL/min    Comment: (NOTE) Calculated using the CKD-EPI Creatinine Equation (2021)    Anion gap 7 5 - 15    Comment: Performed at [facility], [place] [address], [place], [place] [address]  Phosphorus  Status: None   Collection Time: 07/16/22  3:46 AM  Result Value Ref Range   Phosphorus 3.1 2.5 - 4.6 mg/dL     Comment: Performed at [facility], [place] [address] [place], [place] [address]  Magnesium     Status: None   Collection Time: 07/16/22  3:46 AM  Result Value Ref Range   Magnesium 1.9 1.7 - 2.4 mg/dL    Comment: Performed at [facility], [place] [address] [place], [place] [address]  CBC with Differential/Platelet     Status: Abnormal   Collection Time: 07/17/22  4:14 AM  Result Value Ref Range   WBC 4.0 4.0 - 10.5 K/uL   RBC 3.96 3.87 - 5.11 MIL/uL   Hemoglobin 10.6 (L) 12.0 - 15.0 g/dL   HCT 32.7 (L) 36.0 - 46.0 %   MCV 82.6 80.0 - 100.0 fL   MCH 26.8 26.0 - 34.0 pg   MCHC 32.4 30.0 - 36.0 g/dL   RDW 13.8 11.5 - 15.5 %   Platelets 195 150 - 400 K/uL   nRBC 0.0 0.0 - 0.2 %   Neutrophils Relative % 32 %   Neutro Abs 1.3 (L) 1.7 - 7.7 K/uL   Lymphocytes Relative 50 %   Lymphs Abs 2.0 0.7 - 4.0 K/uL   Monocytes Relative 12 %   Monocytes Absolute 0.5 0.1 - 1.0 K/uL   Eosinophils Relative 5 %   Eosinophils Absolute 0.2 0.0 - 0.5 K/uL   Basophils Relative 1 %   Basophils Absolute 0.0 0.0 - 0.1 K/uL   Immature Granulocytes 0 %   Abs Immature Granulocytes 0.01 0.00 - 0.07 K/uL    Comment: Performed at [facility], [place] [address] [place], [place] [address]  Comprehensive metabolic panel     Status: Abnormal   Collection Time: 07/17/22  4:14 AM  Result Value Ref Range   Sodium 141 135 - 145 mmol/L   Potassium 3.8 3.5 - 5.1 mmol/L   Chloride 109 98 - 111 mmol/L   CO2 26 22 - 32 mmol/L   Glucose, Bld 66 (L) 70 - 99 mg/dL    Comment: Glucose reference range applies only to samples taken after fasting for at least 8 hours.   BUN 19 8 - 23 mg/dL   Creatinine, Ser 0.82 0.44 - 1.00 mg/dL   Calcium 8.9 8.9 - 10.3 mg/dL   Total Protein 6.3 (L) 6.5 - 8.1 g/dL   Albumin 3.1 (L) 3.5 - 5.0 g/dL   AST 32 15 - 41 U/L   ALT 19 0 - 44 U/L   Alkaline Phosphatase 74 38 - 126 U/L   Total Bilirubin 0.4 0.3 - 1.2 mg/dL   GFR, Estimated >60  >60 mL/min    Comment: (NOTE) Calculated using the CKD-EPI Creatinine Equation (2021)    Anion gap 6 5 - 15    Comment: Performed at [facility], [place] [address], [place], [place] [address]  Phosphorus     Status: None   Collection Time: 07/17/22  4:14 AM  Result Value Ref Range   Phosphorus 4.3 2.5 - 4.6 mg/dL    Comment: Performed at [facility], [place] [address], [place],  [address]  Magnesium     Status: None   Collection Time: 07/17/22  4:14 AM  Result Value Ref Range   Magnesium 1.8 1.7 - 2.4 mg/dL    Comment: Performed at [facility], [place] [address] [place],  [place] [address]  Glucose, capillary     Status: Abnormal   Collection Time: 07/17/22  6:15 AM  Result Value Ref Range   Glucose-Capillary 107 (H) 70 - 99 mg/dL    Comment: Glucose reference range applies only to samples taken after fasting for at least 8 hours.   Comment 1 Document in Chart     Current Facility-Administered Medications  Medication Dose Route Frequency Provider Last Rate Last Admin   acetaminophen (TYLENOL) tablet 650 mg  650 mg Oral Q6H PRN Reubin Milan, MD       Or   acetaminophen (TYLENOL) suppository 650 mg  650 mg Rectal Q6H PRN Reubin Milan, MD       cyanocobalamin (VITAMIN B12) tablet 1,000 mcg  1,000 mcg Oral Daily Raiford Noble Kinta, DO   1,000 mcg at 07/17/22 1044   haloperidol lactate (HALDOL) injection 2 mg  2 mg Intravenous Q6H PRN Raiford Noble Latif, DO   2 mg at 07/17/22 0045   lidocaine (LIDODERM) 5 % 1 patch  1 patch Transdermal Q24H Raiford Noble [place]ar[place], DO   1 patch at 07/17/22 1051   ondansetron (ZOFRAN) tablet 4 mg  4 mg Oral Q6H PRN Reubin Milan, MD       Or   ondansetron [facility]) injection 4 mg  4 mg Intravenous Q6H PRN Reubin Milan, MD        Musculoskeletal: Strength & Muscle Tone: within normal limits Gait & Station: normal Patient leans: N/A  Psychiatric Specialty  Exam:  Presentation  General Appearance:  Appropriate for Environment; Casual  Eye Contact: Fair  Speech: Clear and Coherent; Normal Rate  Speech Volume: Normal  Handedness: Right   Mood and Affect  Mood: Euthymic  Affect: Appropriate; Congruent   Thought Process  Thought Processes: Coherent; Linear; Goal Directed  Descriptions of Associations:Intact  Orientation:Full (Time, Place and Person)  Thought Content:Logical  History of Schizophrenia/Schizoaffective disorder:No data recorded Duration of Psychotic Symptoms:No data recorded Hallucinations:Hallucinations: None  Ideas of Reference:None  Suicidal Thoughts:Suicidal Thoughts: No  Homicidal Thoughts:Homicidal Thoughts: No   Sensorium  Memory: Immediate Fair; Recent Fair; Remote Fair  Judgment: Fair  Insight: Good   Executive Functions  Concentration: Fair  Attention Span: Good  Recall: Good  Fund of Knowledge: Good  Language: Good   Psychomotor Activity  Psychomotor Activity: Psychomotor Activity: Normal   Assets  Assets: Communication Skills; Physical Health; Desire for Improvement; Resilience; Social Support; Financial Resources/Insurance   Sleep  Sleep: Sleep: Good   Physical Exam: Physical Exam Vitals and nursing note reviewed.  Constitutional:      Appearance: Normal appearance. She is normal weight.  Skin:    Capillary Refill: Capillary refill takes less than 2 seconds.  Neurological:     General: No focal deficit present.     Mental Status: She is alert and oriented to person, place, and time. Mental status is at baseline.  Psychiatric:        Mood and Affect: Mood normal.        Behavior: Behavior normal.        Thought Content: Thought content normal.        Judgment: Judgment normal.    Review of Systems  Psychiatric/Behavioral:  Positive for memory loss. Negative for depression, hallucinations, substance abuse and suicidal ideas. The patient is  not nervous/anxious and does not have insomnia.   All other systems reviewed and are negative.  Blood pressure 133/63, pulse 71, temperature 97.8 F (36.6 C), temperature source  Oral, resp. rate 16, height '5\' 5"'$  (1.651 m), weight 83.1 kg, SpO2 98 %. Body mass index is 30.49 kg/m.  Patient admitted with worsening cognitive impairment, wandering behaviors, hypothermia, and decrease in mental status. Patient does admit to some decline in memory impairment however does not feel as though she needs to be in the hospital.  Patient does state that her family is concerned about her so she agreed to come to the hospital and be evaluated.  Patient denies any previous psychiatric diagnosis, suicide attempts, and or inpatient admission.  She does provide consent to this writer to obtain collateral information from her son.   CT scan shows atrophy and chronic small vessel ischemic changes, chronic bilateral CVAs, previous aneurysm repair.  Labs returned to normal, only abnormal finding was abnormal CT of head.  On today's reassessment, patient is alert and oriented x 3, displaying no psychosis, delusions, confusion, or disorientation.  She is able to answer all questions appropriately, have lucid conversation, and displayed linear thought processes.  She appears to be back at her baseline, when comparing chart review, family collateral, and patient's report.  Treatment Plan Summary: Plan   -Continue delirium precautions -Consider TOC for assistance with HH or ALF placement.  -Continue current regimen for prn medication. -Referral for caregiver needs and role strain, support services.  -Outpatient referral to neurology for evaluation of cognitive impairment.  -Consult SLP for SLUMS tomorrow.    Psychiatry consult service to sign off at this time. Disposition: No evidence of imminent risk to self or others at present.   Patient does not meet criteria for psychiatric inpatient admission. Supportive therapy  provided about ongoing stressors. Discussed crisis plan, support from social network, calling 911, coming to the Emergency Department, and calling Suicide Hotline.  Suella Broad, FNP 07/17/2022 2:48 PM

## 2022-07-17 NOTE — Evaluation (Signed)
Speech Language Pathology Evaluation Patient Details Name: [name] MRN: [id number] DOB: 11/26/1945 Today's Date: 07/17/2022 Time: 7106-2694 SLP Time Calculation (min) (ACUTE ONLY): 20 min  Problem List:  Patient Active Problem List   Diagnosis Date Noted   Acute metabolic encephalopathy 85/46/2703   Normocytic anemia 07/12/2022   Obesity 07/12/2022   AKI (acute kidney injury) ([place]) 07/12/2022   Hypothermia 09/13/2020   Sinus bradycardia 09/13/2020   Essential hypertension 09/13/2020   Past Medical History:  Past Medical History:  Diagnosis Date   History of breast cancer    20 YEARS AGO   Hypertension    Past Surgical History:  Past Surgical History:  Procedure Laterality Date   COLONOSCOPY WITH PROPOFOL N/A 12/29/2021   Procedure: COLONOSCOPY WITH PROPOFOL;  Surgeon: Carol Ada, MD;  Location: [facility];  Service: Gastroenterology;  Laterality: N/A;   MASTECTOMY     POLYPECTOMY  12/29/2021   Procedure: POLYPECTOMY;  Surgeon: Carol Ada, MD;  Location: [facility];  Service: Gastroenterology;;   HPI:  [name] is a 76 y.o. female with medical history significant of hypertension, breast cancer, unspecified class obesity w/o was brought to the emergency department via EMS after the patient was confused.  She lives by herself.  She has a son who lives in the area who checks on her.  Neighbors felt that she was confused, but it is unclear who called EMS.  MD ordered SLUMS.  Pt admits to some concerns for memory deficits - and they have been present her aneurysm about 20 years ago.  Pt denies missing appointments or medications saying "I only take 2 pills a day, so I take this in the morning".   Assessment / Plan / Recommendation Clinical Impression  SLUMS administered per orders with pt scoring 23/30, indicative of mild cognitive issues per test authors.   Strengths included language skills and recall of narrative level information.  Pt's orientation to day of  the week and year was incorrect but she is oriented to current medical situation. She was able to recall 3/5 words indendently and 2 with category cue.  She admits to being concerned re: potential memory difficulties prior to admit and questioned if she would benefit form memory pill.  SLP will follow up to provide pt with memory compensation strategies.   Note PT and OT recommending SNF follow up. Pt agreeable to plan.    SLP Assessment  SLP Recommendation/Assessment: Patient needs continued Speech [place] Pathology Services SLP Visit Diagnosis: Cognitive communication deficit (R41.841)    Recommendations for follow up therapy are one component of a multi-disciplinary discharge planning process, led by the attending physician.  Recommendations may be updated based on patient status, additional functional criteria and insurance authorization.    Follow Up Recommendations  No SLP follow up    Assistance Recommended at Discharge  None  Functional Status Assessment Patient has had a recent decline in their functional status and/or demonstrates limited ability to make significant improvements in function in a reasonable and predictable amount of time  Frequency and Duration min 1 x/week  1 week      SLP Evaluation Cognition  Overall Cognitive Status: No family/caregiver present to determine baseline cognitive functioning Arousal/Alertness: Awake/alert Orientation Level: Oriented to person;Oriented to place;Disoriented to time;Oriented to situation Year:  (2023 from verbal choice of two, later said 2014) Month: December Day of Week: Incorrect (said Monday - recalled it was Tuesday later in session) Memory: Impaired Memory Impairment: Retrieval deficit (recalled 3/5 words Independently,  2 with various cues) Awareness: Appears intact Problem Solving: Appears intact       Comprehension  Auditory Comprehension Overall Auditory Comprehension: Appears within functional limits for tasks  assessed Yes/No Questions: Not tested Commands: Within Functional Limits Conversation: Complex Visual Recognition/Discrimination Discrimination: Within Function Limits Reading Comprehension Reading Status: Not tested (pt read the date on the board without cue)    Expression Expression Primary Mode of Expression: Verbal Verbal Expression Overall Verbal Expression: Appears within functional limits for tasks assessed Initiation: No impairment Repetition: No impairment Naming: Not tested Pragmatics: No impairment Written Expression Dominant Hand: Right Written Expression: Not tested   Oral / Motor  Oral Motor/Sensory Function Overall Oral Motor/Sensory Function: Within functional limits Motor Speech Overall Motor Speech: Appears within functional limits for tasks assessed Respiration: Within functional limits Phonation: Normal Resonance: Within functional limits Articulation: Within functional limitis Intelligibility: Intelligible Motor Planning: Witnin functional limits Motor Speech Errors: Not applicable Interfering Components: Premorbid status Effective Techniques: Slow rate            Macario Golds 07/17/2022, 6:16 PM Kathleen Lime, MS [facility] SLP Acute Rehab Services Office [phone] Pager [phone]

## 2022-07-17 NOTE — Progress Notes (Signed)
PT alert and oriented at start of shift at 1900. PT became progressively more confused as night went on. PT oriented to self but disoriented to time, place and situation throughout night. Pt restless at times but pleasantly confused. PRN Haldol given once at [place]. PT slept on and off throughout night

## 2022-07-17 NOTE — Progress Notes (Signed)
Mobility Specialist - Progress Note   07/17/22 1548  Mobility  Activity Ambulated with assistance in hallway;Transferred to/from [facility]  Level of Assistance Contact guard assist, steadying assist  Assistive Device Front wheel walker  Distance Ambulated (ft) 240 ft  Activity Response Tolerated well  Mobility Referral Yes  $Mobility charge 1 Mobility   Pt received in bed and agreeable to mobility. Assisted nurse w/ transfer to [facility]. C/o ankle pain during mobility. No complaints during mobility session. Pt to reclinetr after session with all needs met.     [facility]

## 2022-07-17 NOTE — [place] FL2 (Signed)
  [place] MEDICAID FL2 LEVEL OF CARE FORM     IDENTIFICATION  Patient Name: LYRICA MCCLARTY Birthdate: [birth year] Sex: female Admission Date (Current Location): 07/12/2022  [facility] and [place] Number:  Herbalist and Address:  [facility],  [place], [place]      Provider Number: [id number]  Attending Physician Name and Address:  Kerney Elbe, DO  Relative Name and Phone Number:  Labrum Jr,Carl    Current Level of Care: Hospital Recommended Level of Care: [place] Prior Approval Number:    Date Approved/Denied:   PASRR Number: [id number]  Discharge Plan: SNF    Current Diagnoses: Patient Active Problem List   Diagnosis Date Noted   Acute metabolic encephalopathy 87/56/4332   Normocytic anemia 07/12/2022   Obesity 07/12/2022   AKI (acute kidney injury) ([place]) 07/12/2022   Hypothermia 09/13/2020   Sinus bradycardia 09/13/2020   Essential hypertension 09/13/2020    Orientation RESPIRATION BLADDER Height & Weight     Self, Place  Normal Incontinent Weight: 183 lb 3.2 oz (83.1 kg) Height:  '5\' 5"'$  (165.1 cm)  BEHAVIORAL SYMPTOMS/MOOD NEUROLOGICAL BOWEL NUTRITION STATUS      Incontinent, Continent Diet (regular)  AMBULATORY STATUS COMMUNICATION OF NEEDS Skin   Limited Assist Verbally Normal                       Personal Care Assistance Level of Assistance  Bathing, Feeding, Dressing Bathing Assistance: Limited assistance Feeding assistance: Independent Dressing Assistance: Limited assistance     Functional Limitations Info  Sight, Hearing, Speech Sight Info: Adequate Hearing Info: Adequate Speech Info: Adequate    SPECIAL CARE FACTORS FREQUENCY  PT (By licensed PT), OT (By licensed OT)     PT Frequency: 5x a week OT Frequency: 5x a week            Contractures Contractures Info: Not present    Additional Factors Info  Allergies Code Status Info: full Allergies Info:  shellfish           Current Medications (07/17/2022):  This is the current hospital active medication list Current Facility-Administered Medications  Medication Dose Route Frequency Provider Last Rate Last Admin   acetaminophen (TYLENOL) tablet 650 mg  650 mg Oral Q6H PRN Reubin Milan, MD       Or   acetaminophen (TYLENOL) suppository 650 mg  650 mg Rectal Q6H PRN Reubin Milan, MD       cyanocobalamin (VITAMIN B12) tablet 1,000 mcg  1,000 mcg Oral Daily Raiford Noble [place], DO   1,000 mcg at 07/17/22 1044   haloperidol lactate (HALDOL) injection 2 mg  2 mg Intravenous Q6H PRN Raiford Noble Latif, DO   2 mg at 07/17/22 0045   lidocaine (LIDODERM) 5 % 1 patch  1 patch Transdermal Q24H Raiford Noble [place], DO   1 patch at 07/17/22 1051   ondansetron [facility]) tablet 4 mg  4 mg Oral Q6H PRN Reubin Milan, MD       Or   ondansetron [facility]) injection 4 mg  4 mg Intravenous Q6H PRN Reubin Milan, MD         Discharge Medications: Please see discharge summary for a list of discharge medications.  Relevant Imaging Results:  Relevant Lab Results:   Additional Information SSN [id number]  [place], LCSW

## 2022-07-17 NOTE — Progress Notes (Signed)
Blood sugar of 66 on lab work. Juice given to patient. Will recheck blood sugar in 15 min

## 2022-07-18 DIAGNOSIS — G9341 Metabolic encephalopathy: Secondary | ICD-10-CM | POA: Diagnosis not present

## 2022-07-18 DIAGNOSIS — T68XXXD Hypothermia, subsequent encounter: Secondary | ICD-10-CM | POA: Diagnosis not present

## 2022-07-18 DIAGNOSIS — N179 Acute kidney failure, unspecified: Secondary | ICD-10-CM | POA: Diagnosis not present

## 2022-07-18 LAB — COMPREHENSIVE METABOLIC PANEL
ALT: 17 U/L (ref 0–44)
AST: 23 U/L (ref 15–41)
Albumin: 2.8 g/dL — ABNORMAL LOW (ref 3.5–5.0)
Alkaline Phosphatase: 62 U/L (ref 38–126)
Anion gap: 7 (ref 5–15)
BUN: 19 mg/dL (ref 8–23)
CO2: 27 mmol/L (ref 22–32)
Calcium: 8.8 mg/dL — ABNORMAL LOW (ref 8.9–10.3)
Chloride: 107 mmol/L (ref 98–111)
Creatinine, Ser: 0.76 mg/dL (ref 0.44–1.00)
GFR, Estimated: >60 mL/min (ref >60)
Glucose, Bld: 72 mg/dL (ref 70–99)
Potassium: 3.9 mmol/L (ref 3.5–5.1)
Sodium: 141 mmol/L (ref 135–145)
Total Bilirubin: 0.4 mg/dL (ref 0.3–1.2)
Total Protein: 5.7 g/dL — ABNORMAL LOW (ref 6.5–8.1)

## 2022-07-18 LAB — CBC WITH DIFFERENTIAL/PLATELET
Abs Immature Granulocytes: 0.01 10*3/uL (ref 0.00–0.07)
Basophils Absolute: 0 10*3/uL (ref 0.0–0.1)
Basophils Relative: 1 %
Eosinophils Absolute: 0.2 10*3/uL (ref 0.0–0.5)
Eosinophils Relative: 5 %
HCT: 32.7 % — ABNORMAL LOW (ref 36.0–46.0)
Hemoglobin: 10.5 g/dL — ABNORMAL LOW (ref 12.0–15.0)
Immature Granulocytes: 0 %
Lymphocytes Relative: 58 %
Lymphs Abs: 2.4 10*3/uL (ref 0.7–4.0)
MCH: 26.9 pg (ref 26.0–34.0)
MCHC: 32.1 g/dL (ref 30.0–36.0)
MCV: 83.8 fL (ref 80.0–100.0)
Monocytes Absolute: 0.4 10*3/uL (ref 0.1–1.0)
Monocytes Relative: 11 %
Neutro Abs: 1 10*3/uL — ABNORMAL LOW (ref 1.7–7.7)
Neutrophils Relative %: 25 %
Platelets: 195 10*3/uL (ref 150–400)
RBC: 3.9 MIL/uL (ref 3.87–5.11)
RDW: 14 % (ref 11.5–15.5)
WBC: 4 10*3/uL (ref 4.0–10.5)
nRBC: 0 % (ref 0.0–0.2)

## 2022-07-18 LAB — PHOSPHORUS: Phosphorus: 4.2 mg/dL (ref 2.5–4.6)

## 2022-07-18 LAB — MAGNESIUM: Magnesium: 1.5 mg/dL — ABNORMAL LOW (ref 1.7–2.4)

## 2022-07-18 MED ORDER — MAGNESIUM SULFATE 2 GM/50ML IV SOLN
2.0000 g | Freq: Once | INTRAVENOUS | Status: AC
  Administered 2022-07-18: 2 g via INTRAVENOUS
  Filled 2022-07-18: qty 50

## 2022-07-18 MED ORDER — ADULT MULTIVITAMIN W/MINERALS CH
1.0000 | ORAL_TABLET | Freq: Every day | ORAL | Status: DC
  Administered 2022-07-19 – 2022-07-20 (×2): 1 via ORAL
  Filled 2022-07-18 (×2): qty 1

## 2022-07-18 NOTE — Assessment & Plan Note (Signed)
-   Iron stores normal along with ferritin -Folate low end of normal - B12 154 - Continue multivitamin and B12 supplementation

## 2022-07-18 NOTE — Assessment & Plan Note (Addendum)
-   resume home regimen 

## 2022-07-18 NOTE — Hospital Course (Addendum)
[name] is a 76 yo female with PMH HTN, breast cancer, obesity.  She presented with altered mentation.  She lives independently but is cared for by her son who checks on her.  She was noted to be confused by neighbors and was brought to the ER via EMS. On workup she was found to be significantly hypothermic.  It appears that she had an adequate heat for unknown amount of time.  She underwent rewarming measures and had gradual improvement in her mentation. She developed some delirium during hospitalization and was evaluated by psychiatry. Mentation improved slowly and she did not require any ongoing meds.

## 2022-07-18 NOTE — TOC Progression Note (Signed)
Transition of Care [facility]) - Progression Note    Patient Details  Name: [name] MRN: [id number] Date of Birth: 1945/11/15  Transition of Care [facility]) CM/SW [place], LCSW Phone Number: 07/18/2022, 9:40 AM  Clinical Narrative:     TOC CSW spoke with Kia from [facility], they have offered pt a bed. CSW has informed pt and family of bed offer. CSW will start insurance auth when pt is close to being medically stable for d/c . TOC to follow.   Expected Discharge Plan: Home/Self Care Barriers to Discharge: Continued Medical Work up  Expected Discharge Plan and Services Expected Discharge Plan: Home/Self Care   Discharge Planning Services: CM Consult   Living arrangements for the past 2 months: Single Family Home                                       Social Determinants of Health (SDOH) Interventions Housing Interventions: Inpatient TOC (Pt son reports pt cannot afford her gas, and this time of year, her hypothermia is always a case when temperature drops because of this. Inquired after assistance.)  Readmission Risk Interventions     No data to display

## 2022-07-18 NOTE — Progress Notes (Signed)
Progress Note    [name]   GBT:[id number]  DOB: 11-Jul-1946  DOA: 07/12/2022     6 PCP: Velna Hatchet, MD  Initial CC: confusion  Hospital Course: Ms. Vazguez is a 76 yo female with PMH HTN, breast cancer, obesity.  She presented with altered mentation.  She lives independently but is cared for by her son who checks on her.  She was noted to be confused by neighbors and was brought to the ER via EMS. On workup she was found to be significantly hypothermic.  It appears that she had an adequate heat for unknown amount of time.  She underwent rewarming measures and had gradual improvement in her mentation. She developed some delirium during hospitalization and was evaluated by psychiatry.  Interval History:  Patient more cooperative and sitting up in recliner when seen this afternoon.  Assessment and Plan: * Acute metabolic encephalopathy-resolved as of 07/18/2022 - Suspected multifactorial in setting of hypothermia and underlying vascular dementia - B12 level also low and supplementation started - UA not felt to be infected -Evaluated by psychiatry - Continue Haldol as needed  Hypothermia-resolved as of 07/18/2022 - s/p rewarming on admission; unclear why no heat at this time - now normalized   AKI (acute kidney injury) ([facility])-resolved as of 07/18/2022 - baseline creatinine ~ 0.9 - patient presents with increase in creat >0.3 mg/dL above baseline, creat increase >1.5x baseline presumed to have occurred within past 7 days PTA - creat 1.4 on admission - s/p IVF and creat normalized    Obesity - Complicates overall prognosis and care - Body mass index is 30.49 kg/m.  Normocytic anemia - Iron stores normal along with ferritin -Folate low end of normal - B12 154 - Continue multivitamin and B12 supplementation  Essential hypertension - Home regimen on hold for now  Sinus bradycardia-resolved as of 07/18/2022 - Suspected due to hypothermia on admission - Resolved -  TSH normal   Old records reviewed in assessment of this patient  Antimicrobials:   DVT prophylaxis:  SCDs Start: 07/12/22 0803   Code Status:   Code Status: Full Code  Mobility Assessment (last 72 hours)     Mobility Assessment     Row Name 07/18/22 0744 07/17/22 1950 07/17/22 0908 07/16/22 2030 07/16/22 1629   Does patient have an order for bedrest or is patient medically unstable No - Continue assessment No - Continue assessment No - Continue assessment No - Continue assessment No - Continue assessment   What is the highest level of mobility based on the progressive mobility assessment? Level 5 (Walks with assist in room/hall) - Balance while stepping forward/back and can walk in room with assist - Complete Level 5 (Walks with assist in room/hall) - Balance while stepping forward/back and can walk in room with assist - Complete Level 5 (Walks with assist in room/hall) - Balance while stepping forward/back and can walk in room with assist - Complete Level 5 (Walks with assist in room/hall) - Balance while stepping forward/back and can walk in room with assist - Complete Level 5 (Walks with assist in room/hall) - Balance while stepping forward/back and can walk in room with assist - Complete    Row Name 07/16/22 0842 07/15/22 2000         Does patient have an order for bedrest or is patient medically unstable No - Continue assessment No - Continue assessment      What is the highest level of mobility based on the progressive mobility assessment? Level  5 (Walks with assist in room/hall) - Balance while stepping forward/back and can walk in room with assist - Complete Level 5 (Walks with assist in room/hall) - Balance while stepping forward/back and can walk in room with assist - Complete               Barriers to discharge:  Disposition Plan: [place] [facility].  Patient now medically stable Status is: Inpatient  Objective: Blood pressure (!) 117/51, pulse 74, temperature 98.2  F (36.8 C), resp. rate 20, height '5\' 5"'$  (1.651 m), weight 83.1 kg, SpO2 100 %.  Examination:  Physical Exam Constitutional:      Appearance: Normal appearance.  HENT:     Head: Normocephalic and atraumatic.     Mouth/Throat:     Mouth: Mucous membranes are moist.  Eyes:     Extraocular Movements: Extraocular movements intact.  Cardiovascular:     Rate and Rhythm: Normal rate and regular rhythm.  Pulmonary:     Effort: Pulmonary effort is normal.     Breath sounds: Normal breath sounds.  Abdominal:     General: Bowel sounds are normal. There is no distension.     Palpations: Abdomen is soft.  Musculoskeletal:        General: Normal range of motion.     Cervical back: Normal range of motion and neck supple.  Skin:    General: Skin is warm and dry.  Neurological:     Mental Status: She is alert. Mental status is at baseline.  Psychiatric:        Mood and Affect: Mood normal.      Consultants:  Psychiatry  Procedures:    Data Reviewed: Results for orders placed or performed during the hospital encounter of 07/12/22 (from the past 24 hour(s))  Comprehensive metabolic panel     Status: Abnormal   Collection Time: 07/18/22  4:19 AM  Result Value Ref Range   Sodium 141 135 - 145 mmol/L   Potassium 3.9 3.5 - 5.1 mmol/L   Chloride 107 98 - 111 mmol/L   CO2 27 22 - 32 mmol/L   Glucose, Bld 72 70 - 99 mg/dL   BUN 19 8 - 23 mg/dL   Creatinine, Ser 0.76 0.44 - 1.00 mg/dL   Calcium 8.8 (L) 8.9 - 10.3 mg/dL   Total Protein 5.7 (L) 6.5 - 8.1 g/dL   Albumin 2.8 (L) 3.5 - 5.0 g/dL   AST 23 15 - 41 U/L   ALT 17 0 - 44 U/L   Alkaline Phosphatase 62 38 - 126 U/L   Total Bilirubin 0.4 0.3 - 1.2 mg/dL   GFR, Estimated >60 >60 mL/min   Anion gap 7 5 - 15  Phosphorus     Status: None   Collection Time: 07/18/22  4:19 AM  Result Value Ref Range   Phosphorus 4.2 2.5 - 4.6 mg/dL  Magnesium     Status: Abnormal   Collection Time: 07/18/22  4:19 AM  Result Value Ref Range    Magnesium 1.5 (L) 1.7 - 2.4 mg/dL  CBC with Differential/Platelet     Status: Abnormal   Collection Time: 07/18/22  4:19 AM  Result Value Ref Range   WBC 4.0 4.0 - 10.5 K/uL   RBC 3.90 3.87 - 5.11 MIL/uL   Hemoglobin 10.5 (L) 12.0 - 15.0 g/dL   HCT 32.7 (L) 36.0 - 46.0 %   MCV 83.8 80.0 - 100.0 fL   MCH 26.9 26.0 - 34.0 pg   MCHC 32.1  30.0 - 36.0 g/dL   RDW 14.0 11.5 - 15.5 %   Platelets 195 150 - 400 K/uL   nRBC 0.0 0.0 - 0.2 %   Neutrophils Relative % 25 %   Neutro Abs 1.0 (L) 1.7 - 7.7 K/uL   Lymphocytes Relative 58 %   Lymphs Abs 2.4 0.7 - 4.0 K/uL   Monocytes Relative 11 %   Monocytes Absolute 0.4 0.1 - 1.0 K/uL   Eosinophils Relative 5 %   Eosinophils Absolute 0.2 0.0 - 0.5 K/uL   Basophils Relative 1 %   Basophils Absolute 0.0 0.0 - 0.1 K/uL   Immature Granulocytes 0 %   Abs Immature Granulocytes 0.01 0.00 - 0.07 K/uL    I have Reviewed nursing notes, Vitals, and Lab results since pt's last encounter. Pertinent lab results : see above I have ordered test including BMP, CBC, Mg I have reviewed the last note from staff over past 24 hours I have discussed pt's care plan and test results with nursing staff, case manager  Time spent: Greater than 50% of the 55 minute visit was spent in counseling/coordination of care for the patient as laid out in the A&P.    LOS: 6 days   Dwyane Dee, MD [facility] 07/18/2022, 6:00 PM

## 2022-07-18 NOTE — Assessment & Plan Note (Signed)
-   baseline creatinine ~ 0.9 - patient presents with increase in creat >0.3 mg/dL above baseline, creat increase >1.5x baseline presumed to have occurred within past 7 days PTA - creat 1.4 on admission - s/p IVF and creat normalized

## 2022-07-18 NOTE — Assessment & Plan Note (Signed)
-   Complicates overall prognosis and care - Body mass index is 30.49 kg/m.

## 2022-07-18 NOTE — Assessment & Plan Note (Signed)
-   s/p rewarming on admission; unclear why no heat at this time - now normalized

## 2022-07-18 NOTE — Assessment & Plan Note (Addendum)
-   Suspected multifactorial in setting of hypothermia and underlying vascular dementia - B12 level also low and supplementation started - UA not felt to be infected -Evaluated by psychiatry; cleared and no need for scheduled meds

## 2022-07-18 NOTE — Assessment & Plan Note (Signed)
-   Suspected due to hypothermia on admission - Resolved - TSH normal

## 2022-07-19 NOTE — Progress Notes (Signed)
Occupational Therapy Treatment Patient Details Name: [name] MRN: [id number] DOB: 1945/08/24 Today's Date: 07/19/2022   History of present illness 76 y.o. female admitted with acute metabolic encephalopathy. She was brought to the emergency department via EMS after the patient was found confused at home. She presented with environmental hypothermia (gas has been turned off at home) but also AMS with unclear etiology. History significant of hypertension, breast cancer, unspecified class obesity, CVA   OT comments  Patient was motivated to participate in the session and was seen for general functional strengthening & progressive participation in functional activities. She required min assist for supine to sit, min guard assist to stand using a RW, min guard assist for ambulating in her room using a RW, and set-up assist for self feeding seated EOB. She required frequent redirection to tasks, due to being distractible and pleasantly conversational about unrelated topics. She was also noted to be with occasional episodes of slight to moderate confusion, requiring cues for cognitive redirection & re-orientation. She will continue to benefit from further OT services to maximize her safety and independence with self-care tasks.    Recommendations for follow up therapy are one component of a multi-disciplinary discharge planning process, led by the attending physician.  Recommendations may be updated based on patient status, additional functional criteria and insurance authorization.    Follow Up Recommendations  Skilled nursing-short term rehab (<3 hours/day)     Assistance Recommended at Discharge Intermittent Supervision/Assistance  Patient can return home with the following  A little help with bathing/dressing/bathroom;Assistance with cooking/housework;Direct supervision/assist for financial management;Direct supervision/assist for medications management   Equipment Recommendations  None  recommended by OT       Precautions / Restrictions Precautions Precautions: Fall Restrictions Weight Bearing Restrictions: No       Mobility Bed Mobility Overal bed mobility: Needs Assistance Bed Mobility: Supine to Sit     Supine to sit: Min assist, HOB elevated     General bed mobility comments: required verbal cues for attention to task, due to distractibility    Transfers Overall transfer level: Needs assistance Equipment used: Rolling walker (2 wheels) Transfers: Sit to/from Stand Sit to Stand: Min guard           General transfer comment: Supervision for safety         ADL either performed or assessed with clinical judgement   ADL Overall ADL's : Needs assistance/impaired Eating/Feeding: Independent Eating/Feeding Details (indicate cue type and reason): based on clinical judgement Grooming: Set up;Standing;Min guard;Sitting Grooming Details (indicate cue type and reason): set-up seated or min guard standing         Upper Body Dressing : Set up;Sitting   Lower Body Dressing: Minimal assistance                       Cognition Arousal/Alertness: Awake/alert      General Comments: able to follow 1-2 step commands, required occasional redirection to tasks due to being distractible, intermittent slight to moderate confusion noted                   Pertinent Vitals/ Pain       Pain Assessment Pain Assessment: No/denies pain         Frequency  Min 2X/week        Progress Toward Goals  OT Goals(current goals can now be found in the care plan section)  Progress towards OT goals: Progressing toward goals  Acute Rehab OT Goals Patient  Stated Goal: to get better and return home OT Goal Formulation: With patient Time For Goal Achievement: 07/29/22 Potential to Achieve Goals: Good  Plan Discharge plan remains appropriate       AM-PAC OT "6 Clicks" Daily Activity     Outcome Measure   Help from another person eating meals?:  None Help from another person taking care of personal grooming?: A Little Help from another person toileting, which includes using toliet, bedpan, or urinal?: A Little Help from another person bathing (including washing, rinsing, drying)?: A Little Help from another person to put on and taking off regular upper body clothing?: A Little Help from another person to put on and taking off regular lower body clothing?: A Little 6 Click Score: 19    End of Session Equipment Utilized During Treatment: Rolling walker (2 wheels);Gait belt  OT Visit Diagnosis: Other symptoms and signs involving cognitive function;Unsteadiness on feet (R26.81)   Activity Tolerance Patient tolerated treatment well   Patient Left in bed;with bed alarm set;with call bell/phone within reach   Nurse Communication Mobility status        Time: 4196-2229 OT Time Calculation (min): 17 min  Charges: OT General Charges $OT Visit: 1 Visit OT Treatments $Therapeutic Activity: 8-22 mins     Leota Sauers, OTR/ 07/19/2022, 3:57 PM

## 2022-07-19 NOTE — Progress Notes (Signed)
Mobility Specialist - Progress Note   07/19/22 1116  Mobility  Activity Ambulated with assistance in hallway  Level of Assistance Contact guard assist, steadying assist  Assistive Device Front wheel walker  Distance Ambulated (ft) 350 ft  Range of Motion/Exercises Active  Activity Response Tolerated well  Mobility Referral Yes  $Mobility charge 1 Mobility   Pt was found on recliner chair and agreeable to ambulate. Stated feeling tired from last night and during ambulation said that her legs did not feel "straight". At EOS returned to recliner chair with necessities in reach and chair alarm on.  Ferd Hibbs Mobility Specialist

## 2022-07-19 NOTE — Progress Notes (Signed)
Physical Therapy Treatment Patient Details Name: [name] MRN: [id number] DOB: 1945/11/08 Today's Date: 07/19/2022   History of Present Illness 76 y.o. female admitted with acute metabolic encephalopathy. She was brought to the emergency department via EMS after the patient was found confused at home. She presented with environmental hypothermia (gas has been turned off at home) but also AMS with unclear etiology. History significant of hypertension, breast cancer, unspecified class obesity, CVA    PT Comments     General Gait Details: started amb pt with walker present with slightly poor forward flexed posture and requiring 25% VC's on proper walker to self distance as well as safety with turns.  Prior to admit, pt was amb with a cane.  Trial amb with a SPC pt required even more asisst to steady, present with decreased weight shift to RIGHT, decreased step length, decreased gait speed and increased instability."I don't feel very steady", stated pt.  HIGH FALL RISK.  With extended hospital LOS pt presents with a decline in her prior level of mobility.  Pt will need ST Rehab at SNF to address mobility and functional decline prior to safely returning home.   Recommendations for follow up therapy are one component of a multi-disciplinary discharge planning process, led by the attending physician.  Recommendations may be updated based on patient status, additional functional criteria and insurance authorization.  Follow Up Recommendations  Skilled nursing-short term rehab (<3 hours/day) Can patient physically be transported by private vehicle: Yes   Assistance Recommended at Discharge Frequent or constant Supervision/Assistance  Patient can return home with the following A little help with walking and/or transfers;A little help with bathing/dressing/bathroom;Assistance with cooking/housework;Assist for transportation;Help with stairs or ramp for entrance   Equipment Recommendations  None  recommended by PT    Recommendations for Other Services       Precautions / Restrictions Precautions Precautions: Fall Restrictions Weight Bearing Restrictions: No     Mobility  Bed Mobility               General bed mobility comments: OOB in recliner    Transfers Overall transfer level: Needs assistance Equipment used: Rolling walker (2 wheels) Transfers: Sit to/from Stand Sit to Stand: Supervision, Min guard           General transfer comment: Supv for safety. Cues for safety, increased time    Ambulation/Gait Ambulation/Gait assistance: Min assist, Max assist Gait Distance (Feet): 45 Feet Assistive device: Rolling walker (2 wheels), Quad cane, None Gait Pattern/deviations: Step-through pattern, Decreased stride length, Decreased stance time - right Gait velocity: decreased     General Gait Details: started amb pt with walker present with slightly poor forward flexed posture and requiring 25% VC's on proper walker to self distance as well as safety with turns.  Prior to admit, pt was amb with a cane.  Trial amb with a SPC pt required even more asisst to steady, present with decreased weight shift to RIGHT, decreased step length, decreased gait speed and increased instability."I don't feel very steady", stated pt.  HIGH FALL RISK.  With extended hospital LOS pt presents with a decline in her prior level of mobility.   Stairs             Wheelchair Mobility    Modified Jarriel (Stroke Patients Only)       Balance  Cognition Arousal/Alertness: Awake/alert   Overall Cognitive Status: Within Functional Limits for tasks assessed                                 General Comments: AxO x 3 very pleasant        Exercises      General Comments        Pertinent Vitals/Pain Pain Assessment Pain Assessment: Faces Faces Pain Scale: Hurts a little bit Pain Location: B knee  pain (chornic ) Pain Descriptors / Indicators: Aching Pain Intervention(s): Monitored during session, Repositioned    Home Living                          Prior Function            PT Goals (current goals can now be found in the care plan section) Progress towards PT goals: Progressing toward goals    Frequency    Min 2X/week      PT Plan Current plan remains appropriate    Co-evaluation              AM-PAC PT "6 Clicks" Mobility   Outcome Measure  Help needed turning from your back to your side while in a flat bed without using bedrails?: A Little Help needed moving from lying on your back to sitting on the side of a flat bed without using bedrails?: A Little Help needed moving to and from a bed to a chair (including a wheelchair)?: A Little Help needed standing up from a chair using your arms (e.g., wheelchair or bedside chair)?: A Little Help needed to walk in hospital room?: A Lot Help needed climbing 3-5 steps with a railing? : Total 6 Click Score: 15    End of Session Equipment Utilized During Treatment: Gait belt Activity Tolerance: Patient limited by fatigue Patient left: in chair;with call bell/phone within reach;with chair alarm set Nurse Communication: Mobility status PT Visit Diagnosis: Muscle weakness (generalized) (M62.81);Difficulty in walking, not elsewhere classified (R26.2)     Time: 6195-0932 PT Time Calculation (min) (ACUTE ONLY): 13 min  Charges:  $Gait Training: 8-22 mins                     Rica Koyanagi  PTA [place] Office M-F          [phone] Weekend pager [phone]

## 2022-07-19 NOTE — Progress Notes (Signed)
Progress Note    GLENDA SPELMAN   ZOX:[id number]  DOB: 05/30/1946  DOA: 07/12/2022     7 PCP: Velna Hatchet, MD  Initial CC: confusion  Hospital Course: Ms. Litzinger is a 76 yo female with PMH HTN, breast cancer, obesity.  She presented with altered mentation.  She lives independently but is cared for by her son who checks on her.  She was noted to be confused by neighbors and was brought to the ER via EMS. On workup she was found to be significantly hypothermic.  It appears that she had an adequate heat for unknown amount of time.  She underwent rewarming measures and had gradual improvement in her mentation. She developed some delirium during hospitalization and was evaluated by psychiatry.  Interval History:  No events overnight.  Sitting comfortably in recliner when seen this morning.  Mentation more improved today.  Assessment and Plan: * Acute metabolic encephalopathy-resolved as of 07/18/2022 - Suspected multifactorial in setting of hypothermia and underlying vascular dementia - B12 level also low and supplementation started - UA not felt to be infected -Evaluated by psychiatry - Continue Haldol as needed  Hypothermia-resolved as of 07/18/2022 - s/p rewarming on admission; unclear why no heat at this time - now normalized   AKI (acute kidney injury) (HCC)-resolved as of 07/18/2022 - baseline creatinine ~ 0.9 - patient presents with increase in creat >0.3 mg/dL above baseline, creat increase >1.5x baseline presumed to have occurred within past 7 days PTA - creat 1.4 on admission - s/p IVF and creat normalized    Obesity - Complicates overall prognosis and care - Body mass index is 30.49 kg/m.  Normocytic anemia - Iron stores normal along with ferritin -Folate low end of normal - B12 154 - Continue multivitamin and B12 supplementation  Essential hypertension - Home regimen on hold for now  Sinus bradycardia-resolved as of 07/18/2022 - Suspected due to  hypothermia on admission - Resolved - TSH normal   Old records reviewed in assessment of this patient  Antimicrobials:   DVT prophylaxis:  SCDs Start: 07/12/22 0803   Code Status:   Code Status: Full Code  Mobility Assessment (last 72 hours)     Mobility Assessment     Row Name 07/19/22 1526 07/19/22 1456 07/19/22 0840 07/18/22 0744 07/17/22 1950   Does patient have an order for bedrest or is patient medically unstable -- -- No - Continue assessment No - Continue assessment No - Continue assessment   What is the highest level of mobility based on the progressive mobility assessment? Level 5 (Walks with assist in room/hall) - Balance while stepping forward/back and can walk in room with assist - Complete Level 5 (Walks with assist in room/hall) - Balance while stepping forward/back and can walk in room with assist - Complete Level 5 (Walks with assist in room/hall) - Balance while stepping forward/back and can walk in room with assist - Complete Level 5 (Walks with assist in room/hall) - Balance while stepping forward/back and can walk in room with assist - Complete Level 5 (Walks with assist in room/hall) - Balance while stepping forward/back and can walk in room with assist - Complete    Row Name 07/17/22 0908 07/16/22 2030         Does patient have an order for bedrest or is patient medically unstable No - Continue assessment No - Continue assessment      What is the highest level of mobility based on the progressive mobility assessment? Level 5 (  Walks with assist in room/hall) - Balance while stepping forward/back and can walk in room with assist - Complete Level 5 (Walks with assist in room/hall) - Balance while stepping forward/back and can walk in room with assist - Complete               Barriers to discharge:  Disposition Plan: [facility].  Patient now medically stable Status is: Inpatient  Objective: Blood pressure 117/63, pulse 70, temperature 97.8 F  (36.6 C), resp. rate 18, height '5\' 5"'$  (1.651 m), weight 83.1 kg, SpO2 97 %.  Examination:  Physical Exam Constitutional:      Appearance: Normal appearance.  HENT:     Head: Normocephalic and atraumatic.     Mouth/Throat:     Mouth: Mucous membranes are moist.  Eyes:     Extraocular Movements: Extraocular movements intact.  Cardiovascular:     Rate and Rhythm: Normal rate and regular rhythm.  Pulmonary:     Effort: Pulmonary effort is normal.     Breath sounds: Normal breath sounds.  Abdominal:     General: Bowel sounds are normal. There is no distension.     Palpations: Abdomen is soft.  Musculoskeletal:        General: Normal range of motion.     Cervical back: Normal range of motion and neck supple.  Skin:    General: Skin is warm and dry.  Neurological:     Mental Status: She is alert. Mental status is at baseline.  Psychiatric:        Mood and Affect: Mood normal.      Consultants:  Psychiatry  Procedures:    Data Reviewed: No results found for this or any previous visit (from the past 24 hour(s)).   I have Reviewed nursing notes, Vitals, and Lab results since pt's last encounter. Pertinent lab results : see above I have reviewed the last note from staff over past 24 hours I have discussed pt's care plan and test results with nursing staff, case manager    LOS: 7 days   Dwyane Dee, MD [facility] 07/19/2022, 4:54 PM

## 2022-07-19 NOTE — Care Management Important Message (Signed)
Important Message  Patient Details IM Letter placed in Patient's room. Name: [name] MRN: [id number] Date of Birth: [birth year]   Medicare Important Message Given:  Yes     Kerin Salen 07/19/2022, 10:48 AM

## 2022-07-19 NOTE — TOC Progression Note (Signed)
Transition of Care [facility]) - Progression Note    Patient Details  Name: [name] MRN: [id number] Date of Birth: [birth year]  Transition of Care [facility]) CM/SW [place], LCSW Phone Number: 07/19/2022, 11:37 AM  Clinical Narrative:    Insurance Therapist, occupational for [employer].   Expected Discharge Plan: Home/Self Care Barriers to Discharge: Continued Medical Work up  Expected Discharge Plan and Services Expected Discharge Plan: Home/Self Care   Discharge Planning Services: CM Consult   Living arrangements for the past 2 months: Single Family Home                                       Social Determinants of Health (SDOH) Interventions Housing Interventions: Inpatient TOC (Pt son reports pt cannot afford her gas, and this time of year, her hypothermia is always a case when temperature drops because of this. Inquired after assistance.)  Readmission Risk Interventions     No data to display

## 2022-07-20 MED ORDER — ADULT MULTIVITAMIN W/MINERALS CH: 1.0000 | ORAL_TABLET | Freq: Every day | ORAL | Status: AC

## 2022-07-20 MED ORDER — CYANOCOBALAMIN 1000 MCG PO TABS: 1000.0000 ug | ORAL_TABLET | Freq: Every day | ORAL | Status: AC

## 2022-07-20 NOTE — Progress Notes (Signed)
Mobility Specialist - Progress Note   07/20/22 1213  Mobility  Activity Ambulated with assistance in hallway  Level of Assistance Standby assist, set-up cues, supervision of patient - no hands on  Assistive Device Front wheel walker  Distance Ambulated (ft) 150 ft  Range of Motion/Exercises Active  Activity Response Tolerated well  Mobility Referral Yes  $Mobility charge 1 Mobility   Pt was found in bed and agreeable to ambulate. Stated wanting to go a shorter distance today and having some shoulder pain. Upon returning to room ambulated to bathroom and back to bed. At EOS returned to bed with necessities in reach and RN in room.  Ferd Hibbs Mobility Specialist

## 2022-07-20 NOTE — TOC Transition Note (Signed)
Transition of Care [facility]) - CM/SW Discharge Note   Patient Details  Name: ICEL CASTLES MRN: [id number] Date of Birth: 05/01/1946  [facility]) CM/SW Contact:  Illene Regulus, LCSW Phone Number: 07/20/2022, 11:04 AM   Clinical Narrative:    CSW spoke with Leone Brand to inform him about his mother D/C. He is requesting PTAR transport to facility.    Final next level of care: Skilled Nursing Facility Barriers to Discharge: No Barriers Identified   Patient Goals and CMS Choice Patient states their goals for this hospitalization and ongoing recovery are:: retrun home      Discharge Placement              Patient [facility] bed at: [facility] Patient to be transferred to facility by: [place] Name of family member notified: Leone Brand Patient and family notified of of transfer: 07/20/22  Discharge Plan and Services   Discharge Planning Services: CM Consult                                 Social Determinants of Health (SDOH) Interventions Housing Interventions: Inpatient TOC (Pt son reports pt cannot afford her gas, and this time of year, her hypothermia is always a case when temperature drops because of this. Inquired after assistance.)   Readmission Risk Interventions     No data to display

## 2022-07-20 NOTE — TOC Progression Note (Addendum)
Transition of Care [facility]) - Progression Note    Patient Details  Name: [name] MRN: [id number] Date of Birth: November 13, 1945  Transition of Care [facility]) CM/SW [place], LCSW Phone Number: 07/20/2022, 9:50 AM  Clinical Narrative:    Insurance auth still pending.  [place]  10:39am Pt's authorization was approved.   Expected Discharge Plan: Home/Self Care Barriers to Discharge: Continued Medical Work up  Expected Discharge Plan and Services Expected Discharge Plan: Home/Self Care   Discharge Planning Services: CM Consult   Living arrangements for the past 2 months: Single Family Home                                       Social Determinants of Health (SDOH) Interventions Housing Interventions: Inpatient TOC (Pt son reports pt cannot afford her gas, and this time of year, her hypothermia is always a case when temperature drops because of this. Inquired after assistance.)  Readmission Risk Interventions     No data to display

## 2022-07-20 NOTE — Progress Notes (Signed)
Report called to [place] at [facility].  PTAR called per TOC.  AVS placed in packet.  IV removed - WNL.  Pt in NAD awaiting transport at this time

## 2022-07-20 NOTE — Discharge Summary (Signed)
Physician Discharge Summary   TEONIA YAGER LSL:[id number] DOB: 03/29/1946 DOA: 07/12/2022  PCP: Velna Hatchet, MD  Admit date: 07/12/2022 Discharge date: 07/20/2022 Barriers to discharge: waiting on insurance auth  Admitted From: Home Disposition:  SNF Discharging physician: Dwyane Dee, MD  Recommendations for Outpatient Follow-up:  Continue chronic management May be developing some signs of dementia  Home Health:  Equipment/Devices:   Discharge Condition: stable CODE STATUS: Full Diet recommendation:  Diet Orders (From admission, onward)     Start     Ordered   07/20/22 0000  Diet general        07/20/22 1057   07/13/22 0826  Diet regular Room service appropriate? No; Fluid consistency: Thin  Diet effective now       Question Answer Comment  Room service appropriate? No   Fluid consistency: Thin      07/13/22 0825            Hospital Course: Ms. Albritton is a 76 yo female with PMH HTN, breast cancer, obesity.  She presented with altered mentation.  She lives independently but is cared for by her son who checks on her.  She was noted to be confused by neighbors and was brought to the ER via EMS. On workup she was found to be significantly hypothermic.  It appears that she had an adequate heat for unknown amount of time.  She underwent rewarming measures and had gradual improvement in her mentation. She developed some delirium during hospitalization and was evaluated by psychiatry. Mentation improved slowly and she did not require any ongoing meds.   Assessment and Plan: * Acute metabolic encephalopathy-resolved as of 07/18/2022 - Suspected multifactorial in setting of hypothermia and underlying vascular dementia - B12 level also low and supplementation started - UA not felt to be infected -Evaluated by psychiatry; cleared and no need for scheduled meds  Hypothermia-resolved as of 07/18/2022 - s/p rewarming on admission; unclear why no heat at this time - now  normalized   AKI (acute kidney injury) (HCC)-resolved as of 07/18/2022 - baseline creatinine ~ 0.9 - patient presents with increase in creat >0.3 mg/dL above baseline, creat increase >1.5x baseline presumed to have occurred within past 7 days PTA - creat 1.4 on admission - s/p IVF and creat normalized    Obesity - Complicates overall prognosis and care - Body mass index is 30.49 kg/m.  Normocytic anemia - Iron stores normal along with ferritin -Folate low end of normal - B12 154 - Continue multivitamin and B12 supplementation  Essential hypertension - resume home regimen  Sinus bradycardia-resolved as of 07/18/2022 - Suspected due to hypothermia on admission - Resolved - TSH normal    Principal Diagnosis: Acute metabolic encephalopathy  Discharge Diagnoses: Active Hospital Problems   Diagnosis Date Noted   Normocytic anemia 07/12/2022   Obesity 07/12/2022   Essential hypertension 09/13/2020    Resolved Hospital Problems   Diagnosis Date Noted Date Resolved   Acute metabolic encephalopathy 11/57/2620 07/18/2022    Priority: 1.   Hypothermia 09/13/2020 07/18/2022    Priority: 2.   AKI (acute kidney injury) ([facility]) 07/12/2022 07/18/2022    Priority: 3.   Sinus bradycardia 09/13/2020 07/18/2022     Discharge Instructions     Diet general   Complete by: As directed    Increase activity slowly   Complete by: As directed       Allergies as of 07/20/2022       Reactions   Shellfish Allergy Hives, Shortness Of Breath  Medication List     STOP taking these medications    clotrimazole 1 % cream Commonly known as: LOTRIMIN       TAKE these medications    aspirin EC 81 MG tablet Take 81 mg by mouth daily. Swallow whole.   cholecalciferol 25 MCG (1000 UNIT) tablet Commonly known as: VITAMIN D3 Take 1,000 Units by mouth daily.   cyanocobalamin 1000 MCG tablet Take 1 tablet (1,000 mcg total) by mouth daily. Start taking on: July 21, 2022    loratadine 10 MG tablet Commonly known as: CLARITIN Take 10 mg by mouth daily as needed for allergies.   losartan 50 MG tablet Commonly known as: COZAAR Take 50 mg by mouth daily.   multivitamin with minerals Tabs tablet Take 1 tablet by mouth daily. Start taking on: July 21, 2022        Allergies  Allergen Reactions   Shellfish Allergy Hives and Shortness Of Breath    Consultations: Psychiatry  Procedures:   Discharge Exam: BP (!) 161/68   Pulse 62   Temp 97.7 F (36.5 C) (Oral)   Resp 20   Ht '5\' 5"'$  (1.651 m)   Wt 83.1 kg   SpO2 100%   BMI 30.49 kg/m  Physical Exam Constitutional:      Appearance: Normal appearance.  HENT:     Head: Normocephalic and atraumatic.     Mouth/Throat:     Mouth: Mucous membranes are moist.  Eyes:     Extraocular Movements: Extraocular movements intact.  Cardiovascular:     Rate and Rhythm: Normal rate and regular rhythm.  Pulmonary:     Effort: Pulmonary effort is normal.     Breath sounds: Normal breath sounds.  Abdominal:     General: Bowel sounds are normal. There is no distension.     Palpations: Abdomen is soft.  Musculoskeletal:        General: Normal range of motion.     Cervical back: Normal range of motion and neck supple.  Skin:    General: Skin is warm and dry.  Neurological:     Mental Status: She is alert. Mental status is at baseline.  Psychiatric:        Mood and Affect: Mood normal.      The results of significant diagnostics from this hospitalization (including imaging, microbiology, ancillary and laboratory) are listed below for reference.   Microbiology: Recent Results (from the past 240 hour(s))  SARS Coronavirus 2 by RT PCR (hospital order, performed in [facility] hospital lab) *cepheid single result test* Anterior Nasal Swab     Status: None   Collection Time: 07/12/22  6:40 AM   Specimen: Anterior Nasal Swab  Result Value Ref Range Status   SARS Coronavirus 2 by RT PCR NEGATIVE  NEGATIVE Final    Comment: (NOTE) SARS-CoV-2 target nucleic acids are NOT DETECTED.  The SARS-CoV-2 RNA is generally detectable in upper and lower respiratory specimens during the acute phase of infection. The lowest concentration of SARS-CoV-2 viral copies this assay can detect is 250 copies / mL. A negative result does not preclude SARS-CoV-2 infection and should not be used as the sole basis for treatment or other patient management decisions.  A negative result may occur with improper specimen collection / handling, submission of specimen other than nasopharyngeal swab, presence of viral mutation(s) within the areas targeted by this assay, and inadequate number of viral copies (<250 copies / mL). A negative result must be combined with clinical observations, patient  history, and epidemiological information.  Fact Sheet for Patients:   https://www.patel.info/  Fact Sheet for Healthcare Providers: https://hall.com/  This test is not yet approved or  cleared by the [place] FDA and has been authorized for detection and/or diagnosis of SARS-CoV-2 by FDA under an Emergency Use Authorization (EUA).  This EUA will remain in effect (meaning this test can be used) for the duration of the COVID-19 declaration under Section 564(b)(1) of the Act, 21 U.S.C. section 360bbb-3(b)(1), unless the authorization is terminated or revoked sooner.  Performed at [facility], [place] [address], [place], [place] [address]   Respiratory (~20 pathogens) panel by PCR     Status: None   Collection Time: 07/12/22  6:40 AM   Specimen: Anterior Nasal Swab; Respiratory  Result Value Ref Range Status   Adenovirus NOT DETECTED NOT DETECTED Final   Coronavirus 229E NOT DETECTED NOT DETECTED Final    Comment: (NOTE) The Coronavirus on the Respiratory Panel, DOES NOT test for the novel  Coronavirus (2019 nCoV)    Coronavirus HKU1 NOT DETECTED  NOT DETECTED Final   Coronavirus NL63 NOT DETECTED NOT DETECTED Final   Coronavirus OC43 NOT DETECTED NOT DETECTED Final   Metapneumovirus NOT DETECTED NOT DETECTED Final   Rhinovirus / Enterovirus NOT DETECTED NOT DETECTED Final   Influenza A NOT DETECTED NOT DETECTED Final   Influenza B NOT DETECTED NOT DETECTED Final   Parainfluenza Virus 1 NOT DETECTED NOT DETECTED Final   Parainfluenza Virus 2 NOT DETECTED NOT DETECTED Final   Parainfluenza Virus 3 NOT DETECTED NOT DETECTED Final   Parainfluenza Virus 4 NOT DETECTED NOT DETECTED Final   Respiratory Syncytial Virus NOT DETECTED NOT DETECTED Final   Bordetella pertussis NOT DETECTED NOT DETECTED Final   Bordetella Parapertussis NOT DETECTED NOT DETECTED Final   Chlamydophila pneumoniae NOT DETECTED NOT DETECTED Final   Mycoplasma pneumoniae NOT DETECTED NOT DETECTED Final    Comment: Performed at [facility], [place]. [address] [place], [place] [address]     Labs: BNP (last 3 results) No results for input(s): "BNP" in the last 8760 hours. Basic Metabolic Panel: Recent Labs  Lab 07/14/22 1904 07/15/22 0349 07/16/22 0346 07/17/22 0414 07/18/22 0419  NA 141 142 140 141 141  K 3.7 4.4 3.7 3.8 3.9  CL 108 109 108 109 107  CO2 24 20* '25 26 27  '$ GLUCOSE 104* 65* 71 66* 72  BUN '22 18 16 19 19  '$ CREATININE 0.87 0.76 0.84 0.82 0.76  CALCIUM 9.0 9.2 9.2 8.9 8.8*  MG 1.6* 1.7 1.9 1.8 1.5*  PHOS 3.4 3.6 3.1 4.3 4.2   Liver Function Tests: Recent Labs  Lab 07/14/22 1904 07/15/22 0349 07/16/22 0346 07/17/22 0414 07/18/22 0419  AST 39 37 34 32 23  ALT '21 17 17 19 17  '$ ALKPHOS 70 73 73 74 62  BILITOT 0.7 0.8 0.7 0.4 0.4  PROT 6.3* 6.3* 6.4* 6.3* 5.7*  ALBUMIN 3.2* 3.2* 3.2* 3.1* 2.8*   No results for input(s): "LIPASE", "AMYLASE" in the last 168 hours. No results for input(s): "AMMONIA" in the last 168 hours. CBC: Recent Labs  Lab 07/14/22 1904 07/15/22 0640 07/16/22 0346 07/17/22 0414 07/18/22 0419  WBC 3.4* 4.3  4.7 4.0 4.0  NEUTROABS  --  1.7 2.0 1.3* 1.0*  HGB 12.6 11.1* 11.1* 10.6* 10.5*  HCT 41.4 34.9* 35.5* 32.7* 32.7*  MCV 88.8 85.5 84.9 82.6 83.8  PLT 174 197 198 195 195   Cardiac Enzymes: No  results for input(s): "CKTOTAL", "CKMB", "CKMBINDEX", "TROPONINI" in the last 168 hours. BNP: Invalid input(s): "POCBNP" CBG: Recent Labs  Lab 07/17/22 0615  GLUCAP 107*   D-Dimer No results for input(s): "DDIMER" in the last 72 hours. Hgb A1c No results for input(s): "HGBA1C" in the last 72 hours. Lipid Profile No results for input(s): "CHOL", "HDL", "LDLCALC", "TRIG", "CHOLHDL", "LDLDIRECT" in the last 72 hours. Thyroid function studies No results for input(s): "TSH", "T4TOTAL", "T3FREE", "THYROIDAB" in the last 72 hours.  Invalid input(s): "FREET3" Anemia work up No results for input(s): "VITAMINB12", "FOLATE", "FERRITIN", "TIBC", "IRON", "RETICCTPCT" in the last 72 hours. Urinalysis    Component Value Date/Time   COLORURINE AMBER (A) 07/12/2022 0115   APPEARANCEUR HAZY (A) 07/12/2022 0115   LABSPEC 1.023 07/12/2022 0115   PHURINE 5.0 07/12/2022 0115   GLUCOSEU NEGATIVE 07/12/2022 0115   HGBUR NEGATIVE 07/12/2022 0115   BILIRUBINUR NEGATIVE 07/12/2022 0115   KETONESUR 5 (A) 07/12/2022 0115   PROTEINUR 30 (A) 07/12/2022 0115   NITRITE NEGATIVE 07/12/2022 0115   LEUKOCYTESUR NEGATIVE 07/12/2022 0115   Sepsis Labs Recent Labs  Lab 07/15/22 0640 07/16/22 0346 07/17/22 0414 07/18/22 0419  WBC 4.3 4.7 4.0 4.0   Microbiology Recent Results (from the past 240 hour(s))  SARS Coronavirus 2 by RT PCR (hospital order, performed in [place] [facility] hospital lab) *cepheid single result test* Anterior Nasal Swab     Status: None   Collection Time: 07/12/22  6:40 AM   Specimen: Anterior Nasal Swab  Result Value Ref Range Status   SARS Coronavirus 2 by RT PCR NEGATIVE NEGATIVE Final    Comment: (NOTE) SARS-CoV-2 target nucleic acids are NOT DETECTED.  The SARS-CoV-2 RNA is generally  detectable in upper and lower respiratory specimens during the acute phase of infection. The lowest concentration of SARS-CoV-2 viral copies this assay can detect is 250 copies / mL. A negative result does not preclude SARS-CoV-2 infection and should not be used as the sole basis for treatment or other patient management decisions.  A negative result may occur with improper specimen collection / handling, submission of specimen other than nasopharyngeal swab, presence of viral mutation(s) within the areas targeted by this assay, and inadequate number of viral copies (<250 copies / mL). A negative result must be combined with clinical observations, patient history, and epidemiological information.  Fact Sheet for Patients:   https://www.patel.info/  Fact Sheet for Healthcare Providers: https://hall.com/  This test is not yet approved or  cleared by the [place] FDA and has been authorized for detection and/or diagnosis of SARS-CoV-2 by FDA under an Emergency Use Authorization (EUA).  This EUA will remain in effect (meaning this test can be used) for the duration of the COVID-19 declaration under Section 564(b)(1) of the Act, 21 U.S.C. section 360bbb-3(b)(1), unless the authorization is terminated or revoked sooner.  Performed at [facility], [place] [address], [place], [place] [address]   Respiratory (~20 pathogens) panel by PCR     Status: None   Collection Time: 07/12/22  6:40 AM   Specimen: Anterior Nasal Swab; Respiratory  Result Value Ref Range Status   Adenovirus NOT DETECTED NOT DETECTED Final   Coronavirus 229E NOT DETECTED NOT DETECTED Final    Comment: (NOTE) The Coronavirus on the Respiratory Panel, DOES NOT test for the novel  Coronavirus (2019 nCoV)    Coronavirus HKU1 NOT DETECTED NOT DETECTED Final   Coronavirus NL63 NOT DETECTED NOT DETECTED Final   Coronavirus OC43 NOT DETECTED NOT DETECTED  Final    Metapneumovirus NOT DETECTED NOT DETECTED Final   Rhinovirus / Enterovirus NOT DETECTED NOT DETECTED Final   Influenza A NOT DETECTED NOT DETECTED Final   Influenza B NOT DETECTED NOT DETECTED Final   Parainfluenza Virus 1 NOT DETECTED NOT DETECTED Final   Parainfluenza Virus 2 NOT DETECTED NOT DETECTED Final   Parainfluenza Virus 3 NOT DETECTED NOT DETECTED Final   Parainfluenza Virus 4 NOT DETECTED NOT DETECTED Final   Respiratory Syncytial Virus NOT DETECTED NOT DETECTED Final   Bordetella pertussis NOT DETECTED NOT DETECTED Final   Bordetella Parapertussis NOT DETECTED NOT DETECTED Final   Chlamydophila pneumoniae NOT DETECTED NOT DETECTED Final   Mycoplasma pneumoniae NOT DETECTED NOT DETECTED Final    Comment: Performed at [facility], [place] [address], [place], [place] [address]    Procedures/Studies: CT HEAD WO CONTRAST (5MM)  Result Date: 07/12/2022 CLINICAL DATA:  Mental status change, unknown cause EXAM: CT HEAD WITHOUT CONTRAST TECHNIQUE: Contiguous axial images were obtained from the base of the skull through the vertex without intravenous contrast. RADIATION DOSE REDUCTION: This exam was performed according to the departmental dose-optimization program which includes automated exposure control, adjustment of the mA and/or kV according to patient size and/or use of iterative reconstruction technique. COMPARISON:  09/14/2020. FINDINGS: Brain: There is periventricular white matter decreased attenuation consistent with small vessel ischemic changes. Ventricles, sulci and cisterns are prominent consistent with age related involutional changes. No acute intracranial hemorrhage, mass effect or shift. No hydrocephalus. Left frontal encephalomalacia identified which is stable finding. Old CVA right frontal convexity. 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 Vascular: No hyperdense vessel or unexpected calcification. Skull: Normal. Negative for fracture or focal lesion.  Sinuses/Orbits: No acute finding. IMPRESSION: Atrophy and chronic small vessel ischemic changes. Chronic bilateral CVAs. Previous aneurysm repair. No acute intracranial process identified. Electronically Signed   By: Sammie Bench M.D.   On: 07/12/2022 11:29     Time coordinating discharge: Over 30 minutes    Dwyane Dee, MD  [facility] 07/20/2022, 10:59 AM

## 2022-07-26 ENCOUNTER — Emergency Department (HOSPITAL_COMMUNITY): Payer: Medicare Other

## 2022-07-26 ENCOUNTER — Emergency Department (HOSPITAL_COMMUNITY)
Admission: EM | Admit: 2022-07-26 | Discharge: 2022-07-26 | Disposition: A | Discharge status: Home / Self Care | Payer: Medicare Other | Attending: Emergency Medicine | Admitting: Emergency Medicine

## 2022-07-26 ENCOUNTER — Other Ambulatory Visit: Payer: Self-pay

## 2022-07-26 DIAGNOSIS — S0990XA Unspecified injury of head, initial encounter: Secondary | ICD-10-CM | POA: Diagnosis not present

## 2022-07-26 DIAGNOSIS — Z79899 Other long term (current) drug therapy: Secondary | ICD-10-CM | POA: Insufficient documentation

## 2022-07-26 DIAGNOSIS — Z7982 Long term (current) use of aspirin: Secondary | ICD-10-CM | POA: Insufficient documentation

## 2022-07-26 DIAGNOSIS — F0390 Unspecified dementia without behavioral disturbance: Secondary | ICD-10-CM | POA: Diagnosis not present

## 2022-07-26 DIAGNOSIS — S022XXA Fracture of nasal bones, initial encounter for closed fracture: Secondary | ICD-10-CM | POA: Insufficient documentation

## 2022-07-26 DIAGNOSIS — I10 Essential (primary) hypertension: Secondary | ICD-10-CM | POA: Diagnosis not present

## 2022-07-26 DIAGNOSIS — Y92129 Unspecified place in nursing home as the place of occurrence of the external cause: Secondary | ICD-10-CM | POA: Diagnosis not present

## 2022-07-26 DIAGNOSIS — S0012XA Contusion of left eyelid and periocular area, initial encounter: Secondary | ICD-10-CM | POA: Insufficient documentation

## 2022-07-26 DIAGNOSIS — S0992XA Unspecified injury of nose, initial encounter: Secondary | ICD-10-CM | POA: Diagnosis present

## 2022-07-26 DIAGNOSIS — W19XXXA Unspecified fall, initial encounter: Secondary | ICD-10-CM | POA: Insufficient documentation

## 2022-07-26 DIAGNOSIS — Y92009 Unspecified place in unspecified non-institutional (private) residence as the place of occurrence of the external cause: Secondary | ICD-10-CM

## 2022-07-26 LAB — CBC WITH DIFFERENTIAL/PLATELET
Abs Immature Granulocytes: 0.02 10*3/uL (ref 0.00–0.07)
Basophils Absolute: 0 10*3/uL (ref 0.0–0.1)
Basophils Relative: 1 %
Eosinophils Absolute: 0.2 10*3/uL (ref 0.0–0.5)
Eosinophils Relative: 2 %
HCT: 38.8 % (ref 36.0–46.0)
Hemoglobin: 12.4 g/dL (ref 12.0–15.0)
Immature Granulocytes: 0 %
Lymphocytes Relative: 26 %
Lymphs Abs: 1.7 10*3/uL (ref 0.7–4.0)
MCH: 26.7 pg (ref 26.0–34.0)
MCHC: 32 g/dL (ref 30.0–36.0)
MCV: 83.4 fL (ref 80.0–100.0)
Monocytes Absolute: 0.6 10*3/uL (ref 0.1–1.0)
Monocytes Relative: 10 %
Neutro Abs: 4 10*3/uL (ref 1.7–7.7)
Neutrophils Relative %: 61 %
Platelets: 219 10*3/uL (ref 150–400)
RBC: 4.65 MIL/uL (ref 3.87–5.11)
RDW: 14 % (ref 11.5–15.5)
WBC: 6.6 10*3/uL (ref 4.0–10.5)
nRBC: 0 % (ref 0.0–0.2)

## 2022-07-26 LAB — COMPREHENSIVE METABOLIC PANEL
ALT: 19 U/L (ref 0–44)
AST: 28 U/L (ref 15–41)
Albumin: 3.2 g/dL — ABNORMAL LOW (ref 3.5–5.0)
Alkaline Phosphatase: 84 U/L (ref 38–126)
Anion gap: 10 (ref 5–15)
BUN: 30 mg/dL — ABNORMAL HIGH (ref 8–23)
CO2: 26 mmol/L (ref 22–32)
Calcium: 9.4 mg/dL (ref 8.9–10.3)
Chloride: 107 mmol/L (ref 98–111)
Creatinine, Ser: 1.01 mg/dL — ABNORMAL HIGH (ref 0.44–1.00)
GFR, Estimated: 58 mL/min — ABNORMAL LOW (ref >60)
Glucose, Bld: 77 mg/dL (ref 70–99)
Potassium: 4.1 mmol/L (ref 3.5–5.1)
Sodium: 143 mmol/L (ref 135–145)
Total Bilirubin: 0.8 mg/dL (ref 0.3–1.2)
Total Protein: 6.9 g/dL (ref 6.5–8.1)

## 2022-07-26 NOTE — ED Notes (Signed)
[facility] arrived to pick up pt and the pt is answering their orientation questions correctly and the pt is refusing to go back to the facility so [facility] states they cannot take the pt.  I will call the pt. Son again but the son was here and stated that the pt could not go home due to dementia.  When the pt just stopped me again and I stated I could not let the side rail down secondary to possible fall, the pt got mad and threw the BP cuff. I will call the son back and assess again

## 2022-07-26 NOTE — ED Triage Notes (Signed)
Pt BIB EMS. Per EMS, pt sustained mechanical fall at nursing home this morning. Pt fell on face. Denies LOC or blood thinners. Mild amount of blood coming from right nare and hematoma on left forehead.

## 2022-07-26 NOTE — ED Notes (Signed)
Pt does have d/c documentation from last admission of new vascular dementia and the pt has agreed to go with [facility].  They have been called for transportation again

## 2022-07-26 NOTE — ED Notes (Signed)
[facility] called unable to give pick up time 

## 2022-07-26 NOTE — ED Provider Notes (Signed)
[facility] EMERGENCY DEPARTMENT Provider Note   CSN: [id number] Arrival date & time: 07/26/22  3710     History  Chief Complaint  Patient presents with   [name] is a 76 y.o. female with vascular dementia, HTN, obesity, anemia presents with fall.   Patient reports falling this morning through a door frame because "she is clumsy." She sustained a mechanical fall at nursing home this morning. Pt fell on face. Denies LOC or blood thinners. Endorses no pain now but states she did have pain in her forehead and nose. No extremity pain, CP, hip pain, back pain or neck pain. A&Ox3 (place, situation, self, but not time, states it's 2019). She denies recent illnesses, f/c, cough, SOB, CP, abd pain, dysuria/hematuria, diarrhea/constipation. Otherwise feels in her normal state of health. Fall was witnessed. Denies generalized weakness, asymmetric weakness, numbness/tingling.  Per chart review, patient was recently admitted for AMS from 07/12/22-07/20/22 resolved on 12/6, presumably due to hypothermia and underlying vascular dementia.    Fall       Home Medications Prior to Admission medications   Medication Sig Start Date End Date Taking? Authorizing Provider  aspirin EC 81 MG tablet Take 81 mg by mouth daily. Swallow whole.    [provider]  cholecalciferol (VITAMIN D3) 25 MCG (1000 UNIT) tablet Take 1,000 Units by mouth daily.    [provider]  cyanocobalamin 1000 MCG tablet Take 1 tablet (1,000 mcg total) by mouth daily. 07/21/22   Dwyane Dee, MD  loratadine (CLARITIN) 10 MG tablet Take 10 mg by mouth daily as needed for allergies.    [provider]  losartan (COZAAR) 50 MG tablet Take 50 mg by mouth daily. 11/23/21   [provider]  Multiple Vitamin (MULTIVITAMIN WITH MINERALS) TABS tablet Take 1 tablet by mouth daily. 07/21/22   Dwyane Dee, MD      Allergies    Shellfish allergy    Review of Systems    Review of Systems Review of systems Negative for f/c, recent illnesses.  A 10 point review of systems was performed and is negative unless otherwise reported in HPI.  Physical Exam Updated Vital Signs BP (!) 150/72   Pulse (!) 59   Temp (!) 97.4 F (36.3 C)   Resp 13   Ht '5\' 4"'$  (1.626 m)   Wt 81.6 kg   SpO2 97%   BMI 30.90 kg/m  Physical Exam General: Normal appearing elderly female, lying in bed.  HEENT: PERRLA, EOMI. 2x2 cm hematoma to L eyebrow with mild tenderness. No skull deformities/depressions, no scalp lacerations. Forehead stable. Nasal bridge with mild edema, no septal hematoma, minimal dried blood in nares. No dentures in place. Sclera anicteric, MMM, trachea midline. C collar in place, no c-spine tenderness or stepoffs.  Cardiology: RRR, no murmurs/rubs/gallops. BL radial and DP pulses equal bilaterally. S/p R mastectomy. No chest wall tenderness to palpation or crepitus. Resp: Normal respiratory rate and effort. CTAB, no wheezes, rhonchi, crackles.  Abd: Soft, non-tender, non-distended. No rebound tenderness or guarding.  GU: Deferred. MSK: No peripheral edema or signs of trauma. Extremities without deformity or TTP. No cyanosis or clubbing. Skin: warm, dry. No rashes or lesions. Back: No T or L spine tenderness Neuro: A&Ox4, CNs II-XII grossly intact. MAEs. Sensation grossly intact.  Psych: Normal mood and affect.   ED Results / Procedures / Treatments   Labs (all labs ordered are listed, but only abnormal results are displayed) Labs Reviewed  COMPREHENSIVE METABOLIC PANEL - Abnormal; Notable for the following components:      Result Value   BUN 30 (*)    Creatinine, Ser 1.01 (*)    Albumin 3.2 (*)    GFR, Estimated 58 (*)    All other components within normal limits  CBC WITH DIFFERENTIAL/PLATELET  URINALYSIS, ROUTINE W REFLEX MICROSCOPIC    EKG EKG Interpretation  Date/Time:  Thursday July 26 2022 08:00:50 EST Ventricular Rate:  59 PR  Interval:  173 QRS Duration: 83 QT Interval:  410 QTC Calculation: 407 R Axis:   43 Text Interpretation: Sinus rhythm Probable left atrial enlargement Abnormal R-wave progression, early transition Confirmed by Cindee Lame [phone]) on 07/26/2022 8:48:43 AM  Radiology CT Maxillofacial Wo Contrast  Result Date: 07/26/2022 CLINICAL DATA:  Trauma EXAM: CT MAXILLOFACIAL WITHOUT CONTRAST TECHNIQUE: Multidetector CT imaging of the maxillofacial structures was performed. Multiplanar CT image reconstructions were also generated. RADIATION DOSE REDUCTION: This exam was performed according to the departmental dose-optimization program which includes automated exposure control, adjustment of the mA and/or kV according to patient size and/or use of iterative reconstruction technique. COMPARISON:  Same day CT head FINDINGS: Assessment is slightly limited due to motion artifact. Osseous: Redemonstrated mildly displaced fractures of the bilateral nasal bones. There is also a mildly displaced fracture involving the nasal septum (series 5, image 64). Patient is edentulous. Orbits: Negative. No traumatic or inflammatory finding. Sinuses: Clear. Soft tissues: Soft tissue hematoma along the left frontal scalp/periorbital soft tissues. 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 Please see same-day CT head for additional findings. IMPRESSION: 1. Redemonstrated mildly displaced fractures of the bilateral nasal bones and nasal septum. 2. Soft tissue hematoma along the left frontal scalp/periorbital soft tissues. 3. Please see same day CT head for additional findings. Electronically Signed   By: Marin Roberts M.D.   On: 07/26/2022 09:05   CT Head Wo Contrast  Result Date: 07/26/2022 CLINICAL DATA:  Provided history: Head trauma, minor. Fall. Neck trauma. EXAM: CT HEAD WITHOUT CONTRAST CT CERVICAL SPINE WITHOUT CONTRAST TECHNIQUE: Multidetector CT imaging of the head and cervical  spine was performed following the standard protocol without intravenous contrast. Multiplanar CT image reconstructions of the cervical spine were also generated. RADIATION DOSE REDUCTION: This exam was performed according to the departmental dose-optimization program which includes automated exposure control, adjustment of the mA and/or kV according to patient size and/or use of iterative reconstruction technique. COMPARISON:  Head CT 07/12/2022. FINDINGS: CT HEAD FINDINGS Brain: Redemonstrated focus of chronic encephalomalacia/gliosis within the anterior and inferior left frontal lobe. Redemonstrated small chronic cortical/subcortical infarcts within the posterior right frontal lobe and right parietal lobe. Background moderate patchy and ill-defined hypoattenuation within the cerebral white matter, nonspecific but compatible with chronic small vessel ischemic disease. There is no acute intracranial hemorrhage. No acute demarcated cortical infarct. No extra-axial fluid collection. No evidence of an intracranial mass. No midline shift. Vascular: No hyperdense vessel. Embolization coil mass in the region of the anterior communicating artery. Atherosclerotic calcifications. Skull: No fracture or aggressive osseous lesion. Sinuses/Orbits: No mass or acute finding within the imaged orbits. Mild mucosal thickening scattered within the bilateral ethmoid air cells. Other: Acute, displaced fracture of the left nasal bone with adjacent subcutaneous emphysema and overlying soft tissue swelling. Left frontal scalp and periorbital hematoma. CT CERVICAL SPINE FINDINGS Alignment: 2 mm C3-C4 grade 1 anterolisthesis. 3 mm C7-T1 grade 1 anterolisthesis. Skull base and vertebrae: The basion-dental and atlanto-dental intervals are maintained.No evidence of acute fracture  to the cervical spine. Soft tissues and spinal canal: No prevertebral fluid or swelling. No visible canal hematoma. Disc levels: Cervical spondylosis with multilevel  disc space narrowing, disc bulges, posterior disc osteophyte complexes, uncovertebral hypertrophy and facet arthrosis. No appreciable high-grade spinal canal stenosis. Multilevel bony neural foraminal narrowing. Upper chest: No consolidation within the imaged lung apices. No visible pneumothorax. Other: Atherosclerotic plaque within the proximal major branch vessels of the neck and bilateral carotid arteries. IMPRESSION: CT head: 1. No evidence of acute intracranial abnormality. 2. Redemonstrated focus of chronic encephalomalacia/gliosis within the anterior and inferior left frontal lobe. 3. Redemonstrated small chronic infarcts within the posterior right frontal lobe and right parietal lobe. 4. Background moderate cerebral white matter chronic small vessel ischemic disease. 5. Sequelae of prior aneurysm embolization. 6. Mild generalized cerebral atrophy. 7. Acute, displaced fracture of the left nasal bone with adjacent subcutaneous emphysema (suggesting a laceration), as well as overlying soft tissue swelling. The maxillofacial structures are incompletely imaged. Consider a dedicated maxillofacial CT to assess for additional acute maxillofacial fractures. 8. Left frontal scalp and periorbital hematoma. CT cervical spine: 1. No evidence of acute fracture to the cervical spine. 2. C3-C4 and C7-T1 grade 1 anterolisthesis. 3. Dextrocurvature of the cervical spine. 4. Cervical spondylosis, as described. Electronically Signed   By: Kellie Simmering D.O.   On: 07/26/2022 08:12   CT Cervical Spine Wo Contrast  Result Date: 07/26/2022 CLINICAL DATA:  Provided history: Head trauma, minor. Fall. Neck trauma. EXAM: CT HEAD WITHOUT CONTRAST CT CERVICAL SPINE WITHOUT CONTRAST TECHNIQUE: Multidetector CT imaging of the head and cervical spine was performed following the standard protocol without intravenous contrast. Multiplanar CT image reconstructions of the cervical spine were also generated. RADIATION DOSE REDUCTION: This  exam was performed according to the departmental dose-optimization program which includes automated exposure control, adjustment of the mA and/or kV according to patient size and/or use of iterative reconstruction technique. COMPARISON:  Head CT 07/12/2022. FINDINGS: CT HEAD FINDINGS Brain: Redemonstrated focus of chronic encephalomalacia/gliosis within the anterior and inferior left frontal lobe. Redemonstrated small chronic cortical/subcortical infarcts within the posterior right frontal lobe and right parietal lobe. Background moderate patchy and ill-defined hypoattenuation within the cerebral white matter, nonspecific but compatible with chronic small vessel ischemic disease. There is no acute intracranial hemorrhage. No acute demarcated cortical infarct. No extra-axial fluid collection. No evidence of an intracranial mass. No midline shift. Vascular: No hyperdense vessel. Embolization coil mass in the region of the anterior communicating artery. Atherosclerotic calcifications. Skull: No fracture or aggressive osseous lesion. Sinuses/Orbits: No mass or acute finding within the imaged orbits. Mild mucosal thickening scattered within the bilateral ethmoid air cells. Other: Acute, displaced fracture of the left nasal bone with adjacent subcutaneous emphysema and overlying soft tissue swelling. Left frontal scalp and periorbital hematoma. CT CERVICAL SPINE FINDINGS Alignment: 2 mm C3-C4 grade 1 anterolisthesis. 3 mm C7-T1 grade 1 anterolisthesis. Skull base and vertebrae: The basion-dental and atlanto-dental intervals are maintained.No evidence of acute fracture to the cervical spine. Soft tissues and spinal canal: No prevertebral fluid or swelling. No visible canal hematoma. Disc levels: Cervical spondylosis with multilevel disc space narrowing, disc bulges, posterior disc osteophyte complexes, uncovertebral hypertrophy and facet arthrosis. No appreciable high-grade spinal canal stenosis. Multilevel bony neural  foraminal narrowing. Upper chest: No consolidation within the imaged lung apices. No visible pneumothorax. Other: Atherosclerotic plaque within the proximal major branch vessels of the neck and bilateral carotid arteries. IMPRESSION: CT head: 1. No evidence of acute intracranial abnormality. 2.  Redemonstrated focus of chronic encephalomalacia/gliosis within the anterior and inferior left frontal lobe. 3. Redemonstrated small chronic infarcts within the posterior right frontal lobe and right parietal lobe. 4. Background moderate cerebral white matter chronic small vessel ischemic disease. 5. Sequelae of prior aneurysm embolization. 6. Mild generalized cerebral atrophy. 7. Acute, displaced fracture of the left nasal bone with adjacent subcutaneous emphysema (suggesting a laceration), as well as overlying soft tissue swelling. The maxillofacial structures are incompletely imaged. Consider a dedicated maxillofacial CT to assess for additional acute maxillofacial fractures. 8. Left frontal scalp and periorbital hematoma. CT cervical spine: 1. No evidence of acute fracture to the cervical spine. 2. C3-C4 and C7-T1 grade 1 anterolisthesis. 3. Dextrocurvature of the cervical spine. 4. Cervical spondylosis, as described. Electronically Signed   By: Kellie Simmering D.O.   On: 07/26/2022 08:12   DG Chest 2 View  Result Date: 07/26/2022 CLINICAL DATA:  Fall today. EXAM: CHEST - 2 VIEW COMPARISON:  09/13/2020 FINDINGS: Normal heart size and mediastinal contours. Aortic tortuosity no acute infiltrate or edema. No effusion or pneumothorax. No acute osseous findings. Postoperative right axilla. IMPRESSION: No active cardiopulmonary disease. Electronically Signed   By: Jorje Guild M.D.   On: 07/26/2022 07:46    Procedures Procedures    Medications Ordered in ED Medications - No data to display  ED Course/ Medical Decision Making/ A&P                          Medical Decision Making Amount and/or Complexity of Data  Reviewed Labs: ordered. Radiology: ordered. Decision-making details documented in ED Course.    This patient presents to the ED for concern of fall, head injury, facial pain; this involves an extensive number of treatment options, and is a complaint that carries with it a high risk of complications and morbidity.  I considered the following differential and admission for this acute, potentially life threatening condition.  MDM:    DDX for trauma includes but is not limited to:  -Head Injury such as skull fx or ICH -Nasal bone fracture w/ nasal swelling/pain, no septal hematoma -Chest Injury and Abdominal Injury - no chest or abdominal TTP, report of pain, or e/o abnormality on exam -Spinal Cord or Vertebral injury - in C-collar but no significant C-spine pain -Fractures - no extremity TTP or e/o trauma/deformity  Clinical Course as of 07/26/22 0915  Thu Jul 26, 2022  0829 CT Head Wo Contrast IMPRESSION: CT head: 1. No evidence of acute intracranial abnormality. 2. Redemonstrated focus of chronic encephalomalacia/gliosis within the anterior and inferior left frontal lobe. 3. Redemonstrated small chronic infarcts within the posterior right frontal lobe and right parietal lobe. 4. Background moderate cerebral white matter chronic small vessel ischemic disease. 5. Sequelae of prior aneurysm embolization. 6. Mild generalized cerebral atrophy. 7. Acute, displaced fracture of the left nasal bone with adjacent subcutaneous emphysema (suggesting a laceration), as well as overlying soft tissue swelling. The maxillofacial structures are incompletely imaged. Consider a dedicated maxillofacial CT to assess for additional acute maxillofacial fractures. 8. Left frontal scalp and periorbital hematoma.  CT cervical spine:  1. No evidence of acute fracture to the cervical spine. 2. C3-C4 and C7-T1 grade 1 anterolisthesis. 3. Dextrocurvature of the cervical spine. 4. Cervical spondylosis, as  described. [HN]  [id number] CT Maxillofacial Wo Contrast IMPRESSION: 1. Redemonstrated mildly displaced fractures of the bilateral nasal bones and nasal septum. 2. Soft tissue hematoma along the left frontal scalp/periorbital soft tissues.   [  HN]    Clinical Course User Index [HN] Audley Hose, MD     Labs: I Ordered, and personally interpreted labs.  The pertinent results include:  CBC, CMP. Cr 1.01 with baseline 0.7-0.98   Imaging Studies ordered: I ordered imaging studies including CT face, head, c-spine, CXR I independently visualized and interpreted imaging. I agree with the radiologist interpretation  Additional history obtained from chart review.    Cardiac Monitoring: The patient was maintained on a cardiac monitor.  I personally viewed and interpreted the cardiac monitored which showed an underlying rhythm of: NSR  Reevaluation: After the interventions noted above, I reevaluated the patient and found that they have :improved  Social Determinants of Health: Patient lives in SNF  Disposition:  W/u reassuring, imaging only significant for nasal bone fractures. CBC/CMP unremarkable. DC w/ discharge instructions, return precautions, f/u with ENT and PCP  Co morbidities that complicate the patient evaluation  Past Medical History:  Diagnosis Date   History of breast cancer    20 YEARS AGO   Hypertension      Medicines No orders of the defined types were placed in this encounter.   I have reviewed the patients home medicines and have made adjustments as needed  Problem List / ED Course: Problem List Items Addressed This Visit   None Visit Diagnoses     Fall in home, initial encounter    -  Primary   Minor head injury, initial encounter       Closed fracture of nasal bone, initial encounter                      This note was created using dictation software, which may contain spelling or grammatical errors.    Audley Hose, MD 07/26/22  [phone]

## 2022-07-26 NOTE — Discharge Instructions (Signed)
Thank you for coming to [facility]. You were seen for fall. We did an exam, labs, and imaging, and these showed nasal bone fractures.  Please follow up with your primary care provider within 1 week. Please call [facility] specialists at [phone] to make an appointment to follow up about your nasal fractures. You can take 1,000 mg tylenol every 8 hours as needed for pain.   Do not hesitate to return to the ED or call 911 if you experience: -Worsening symptoms -Lightheadedness, passing out -Fevers/chills -Anything else that concerns you

## 2022-07-26 NOTE — ED Notes (Signed)
Patient transported to X-ray 

## 2022-07-26 NOTE — Progress Notes (Signed)
Responded to page to visit with pt..  Chaplain provided  listening,emotional and spiritual support. Pt was being discharged but doesn't want to leave.  Chaplain available as needed.  Jaclynn Major, [place], [facility], Pager [phone]

## 2022-07-26 NOTE — ED Notes (Signed)
Spoke with son son states the pt house is not ready and that it is not safe she has early dementia and he has POA and she cannot go home.  Pt spoke with son no verdict made pt is now on the phone with someone else

## 2024-03-03 ENCOUNTER — Ambulatory Visit (INDEPENDENT_AMBULATORY_CARE_PROVIDER_SITE_OTHER): Admitting: Podiatry

## 2024-03-03 VITALS — Ht 64.0 in | Wt 180.0 lb

## 2024-03-03 DIAGNOSIS — L84 Corns and callosities: Secondary | ICD-10-CM | POA: Diagnosis not present

## 2024-03-03 NOTE — Patient Instructions (Signed)
 Look for urea 40% cream or ointment and apply to the thickened dry skin / calluses. This can be bought over the counter, at a pharmacy or online such as [employer].

## 2024-03-08 ENCOUNTER — Encounter: Payer: Self-pay | Admitting: Podiatry

## 2024-03-08 NOTE — Progress Notes (Signed)
  Subjective:  Patient ID: [name], female    DOB: [birth year],  MRN: [id number]  Chief Complaint  Patient presents with   Callouses    Rm 2 Patient is here for left foot callus located on the heel.Patient pain when pressure is placed on left heel. Pain described as a sharp stabbing pain.    78 y.o. female presents with the above complaint. History confirmed with patient.   Objective:  Physical Exam: warm, good capillary refill, no trophic changes or ulcerative lesions, normal DP and PT pulses, normal sensory exam, and large diffuse plantar medial callus left heel  Assessment:   1. Callus of foot      Plan:  Patient was evaluated and treated and all questions answered.   All symptomatic hyperkeratoses were safely debrided as a courtesy with a sterile #15 blade to patient's level of comfort without incident. We discussed preventative and palliative care of these lesions including supportive and accommodative shoegear, padding, prefabricated and custom molded accommodative orthoses, use of a pumice stone and lotions/creams daily.  I recommended using urea 40% cream and she will purchase this today.   Return if symptoms worsen or fail to improve.
